# Patient Record
Sex: Female | Born: 1980 | Race: White | Hispanic: No | Smoking: Current every day smoker
Health system: Southern US, Community
[De-identification: ages and names within clinical notes are randomized; demographics above are authoritative.]

## PROBLEM LIST (undated history)

## (undated) DIAGNOSIS — G8929 Other chronic pain: Secondary | ICD-10-CM

## (undated) DIAGNOSIS — F419 Anxiety disorder, unspecified: Secondary | ICD-10-CM

## (undated) DIAGNOSIS — M549 Dorsalgia, unspecified: Secondary | ICD-10-CM

## (undated) HISTORY — PX: TUBAL LIGATION: SHX77

## (undated) HISTORY — PX: CHOLECYSTECTOMY: SHX55

---
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
## 2007-05-01 ENCOUNTER — Ambulatory Visit: Payer: Self-pay | Admitting: *Deleted

## 2007-05-01 ENCOUNTER — Inpatient Hospital Stay (HOSPITAL_COMMUNITY): Admission: AD | Admit: 2007-05-01 | Discharge: 2007-05-08 | Payer: Self-pay | Admitting: *Deleted

## 2010-04-24 ENCOUNTER — Emergency Department (HOSPITAL_COMMUNITY): Admission: EM | Admit: 2010-04-24 | Discharge: 2010-04-25 | Payer: Self-pay | Admitting: Emergency Medicine
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
## 2011-08-24 LAB — VALPROIC ACID LEVEL: Valproic Acid Lvl: 43.4 — ABNORMAL LOW

## 2011-08-24 LAB — CBC
HCT: 40.2
Hemoglobin: 13.8
MCHC: 34.4
MCV: 93.1
Platelets: 186
RBC: 4.32
RDW: 13.5
WBC: 4.2

## 2011-08-24 LAB — HEPATIC FUNCTION PANEL
ALT: 100 — ABNORMAL HIGH
AST: 88 — ABNORMAL HIGH
Albumin: 3.4 — ABNORMAL LOW
Alkaline Phosphatase: 57
Bilirubin, Direct: 0.2
Indirect Bilirubin: 0.3
Total Bilirubin: 0.5
Total Protein: 6.1

## 2014-12-16 ENCOUNTER — Emergency Department (HOSPITAL_COMMUNITY)
Admission: EM | Admit: 2014-12-16 | Discharge: 2014-12-16 | Discharge status: Against Medical Advice | Payer: Medicare Other | Attending: Emergency Medicine | Admitting: Emergency Medicine

## 2014-12-16 DIAGNOSIS — Z5329 Procedure and treatment not carried out because of patient's decision for other reasons: Secondary | ICD-10-CM | POA: Insufficient documentation

## 2014-12-21 ENCOUNTER — Encounter (HOSPITAL_COMMUNITY): Payer: Self-pay | Admitting: Emergency Medicine

## 2014-12-21 ENCOUNTER — Emergency Department (HOSPITAL_COMMUNITY)
Admission: EM | Admit: 2014-12-21 | Discharge: 2014-12-21 | Disposition: A | Discharge status: Home / Self Care | Payer: Medicare Other | Attending: Emergency Medicine | Admitting: Emergency Medicine

## 2014-12-21 DIAGNOSIS — Z8659 Personal history of other mental and behavioral disorders: Secondary | ICD-10-CM | POA: Diagnosis not present

## 2014-12-21 DIAGNOSIS — M549 Dorsalgia, unspecified: Secondary | ICD-10-CM

## 2014-12-21 DIAGNOSIS — G8929 Other chronic pain: Secondary | ICD-10-CM | POA: Insufficient documentation

## 2014-12-21 DIAGNOSIS — M545 Low back pain: Secondary | ICD-10-CM | POA: Insufficient documentation

## 2014-12-21 DIAGNOSIS — Z720 Tobacco use: Secondary | ICD-10-CM | POA: Insufficient documentation

## 2014-12-21 DIAGNOSIS — M546 Pain in thoracic spine: Secondary | ICD-10-CM | POA: Insufficient documentation

## 2014-12-21 HISTORY — DX: Anxiety disorder, unspecified: F41.9

## 2014-12-21 HISTORY — DX: Dorsalgia, unspecified: M54.9

## 2014-12-21 HISTORY — DX: Other chronic pain: G89.29

## 2014-12-21 MED ORDER — KETOROLAC TROMETHAMINE 60 MG/2ML IM SOLN
60.0000 mg | Freq: Once | INTRAMUSCULAR | Status: AC
  Administered 2014-12-21: 60 mg via INTRAMUSCULAR
  Filled 2014-12-21: qty 2

## 2014-12-21 MED ORDER — HYDROCODONE-ACETAMINOPHEN 5-325 MG PO TABS: 1.0000 | ORAL_TABLET | Freq: Four times a day (QID) | ORAL | Status: DC | PRN

## 2014-12-21 MED ORDER — CYCLOBENZAPRINE HCL 10 MG PO TABS: 10.0000 mg | ORAL_TABLET | Freq: Three times a day (TID) | ORAL | Status: DC | PRN

## 2014-12-21 MED ORDER — PREDNISONE 50 MG PO TABS: 50.0000 mg | ORAL_TABLET | Freq: Every day | ORAL | Status: DC

## 2014-12-21 MED ORDER — MORPHINE SULFATE 4 MG/ML IJ SOLN
6.0000 mg | Freq: Once | INTRAMUSCULAR | Status: AC
  Administered 2014-12-21: 6 mg via INTRAMUSCULAR
  Filled 2014-12-21: qty 2
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
## 2014-12-21 NOTE — Discharge Instructions (Signed)
Return here as needed. Follow up with the specialist.  °
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
## 2014-12-29 ENCOUNTER — Encounter (HOSPITAL_COMMUNITY): Payer: Self-pay | Admitting: Family Medicine

## 2014-12-29 ENCOUNTER — Emergency Department (HOSPITAL_COMMUNITY)
Admission: EM | Admit: 2014-12-29 | Discharge: 2014-12-29 | Disposition: A | Discharge status: Home / Self Care | Payer: Medicare Other | Attending: Emergency Medicine | Admitting: Emergency Medicine

## 2014-12-29 DIAGNOSIS — M549 Dorsalgia, unspecified: Secondary | ICD-10-CM | POA: Diagnosis present

## 2014-12-29 DIAGNOSIS — M545 Low back pain: Secondary | ICD-10-CM | POA: Insufficient documentation

## 2014-12-29 DIAGNOSIS — Z79899 Other long term (current) drug therapy: Secondary | ICD-10-CM | POA: Insufficient documentation

## 2014-12-29 DIAGNOSIS — Z8659 Personal history of other mental and behavioral disorders: Secondary | ICD-10-CM | POA: Diagnosis not present

## 2014-12-29 DIAGNOSIS — Z720 Tobacco use: Secondary | ICD-10-CM | POA: Diagnosis not present

## 2014-12-29 DIAGNOSIS — G8929 Other chronic pain: Secondary | ICD-10-CM | POA: Diagnosis not present

## 2014-12-29 DIAGNOSIS — Z7952 Long term (current) use of systemic steroids: Secondary | ICD-10-CM | POA: Diagnosis not present

## 2014-12-29 DIAGNOSIS — M542 Cervicalgia: Secondary | ICD-10-CM | POA: Insufficient documentation

## 2014-12-29 MED ORDER — PREDNISONE 20 MG PO TABS: ORAL_TABLET | ORAL | Status: DC

## 2014-12-29 MED ORDER — IBUPROFEN 800 MG PO TABS: 800.0000 mg | ORAL_TABLET | Freq: Three times a day (TID) | ORAL | Status: DC | PRN

## 2014-12-29 MED ORDER — KETOROLAC TROMETHAMINE 60 MG/2ML IM SOLN
30.0000 mg | Freq: Once | INTRAMUSCULAR | Status: AC
  Administered 2014-12-29: 30 mg via INTRAMUSCULAR
  Filled 2014-12-29: qty 2

## 2014-12-29 MED ORDER — CYCLOBENZAPRINE HCL 10 MG PO TABS: 10.0000 mg | ORAL_TABLET | Freq: Three times a day (TID) | ORAL | Status: DC | PRN

## 2014-12-29 NOTE — ED Notes (Signed)
Pt here for lower back pain. sts chronic

## 2014-12-29 NOTE — ED Notes (Signed)
Hx of chronic neck and back pain. States has appointment with "a back doctor" in March.

## 2014-12-29 NOTE — Discharge Instructions (Signed)
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
## 2015-01-14 ENCOUNTER — Encounter (HOSPITAL_COMMUNITY): Payer: Self-pay | Admitting: Emergency Medicine

## 2015-01-14 ENCOUNTER — Inpatient Hospital Stay (HOSPITAL_COMMUNITY)
Admission: EM | Admit: 2015-01-14 | Discharge: 2015-01-17 | DRG: 603 | Disposition: A | Discharge status: Home / Self Care | Payer: Medicare Other | Attending: Internal Medicine | Admitting: Internal Medicine

## 2015-01-14 ENCOUNTER — Inpatient Hospital Stay (HOSPITAL_COMMUNITY): Payer: Medicare Other

## 2015-01-14 ENCOUNTER — Emergency Department (HOSPITAL_COMMUNITY): Payer: Medicare Other

## 2015-01-14 DIAGNOSIS — F17210 Nicotine dependence, cigarettes, uncomplicated: Secondary | ICD-10-CM | POA: Diagnosis present

## 2015-01-14 DIAGNOSIS — Z66 Do not resuscitate: Secondary | ICD-10-CM | POA: Diagnosis present

## 2015-01-14 DIAGNOSIS — Z888 Allergy status to other drugs, medicaments and biological substances status: Secondary | ICD-10-CM

## 2015-01-14 DIAGNOSIS — J340 Abscess, furuncle and carbuncle of nose: Secondary | ICD-10-CM | POA: Diagnosis present

## 2015-01-14 DIAGNOSIS — F419 Anxiety disorder, unspecified: Secondary | ICD-10-CM | POA: Diagnosis present

## 2015-01-14 DIAGNOSIS — R229 Localized swelling, mass and lump, unspecified: Secondary | ICD-10-CM | POA: Diagnosis present

## 2015-01-14 DIAGNOSIS — M549 Dorsalgia, unspecified: Secondary | ICD-10-CM | POA: Diagnosis present

## 2015-01-14 DIAGNOSIS — D329 Benign neoplasm of meninges, unspecified: Secondary | ICD-10-CM | POA: Diagnosis present

## 2015-01-14 DIAGNOSIS — Z7952 Long term (current) use of systemic steroids: Secondary | ICD-10-CM

## 2015-01-14 DIAGNOSIS — G8929 Other chronic pain: Secondary | ICD-10-CM | POA: Diagnosis present

## 2015-01-14 DIAGNOSIS — L0201 Cutaneous abscess of face: Principal | ICD-10-CM

## 2015-01-14 DIAGNOSIS — L03211 Cellulitis of face: Secondary | ICD-10-CM | POA: Diagnosis present

## 2015-01-14 DIAGNOSIS — F411 Generalized anxiety disorder: Secondary | ICD-10-CM | POA: Diagnosis not present

## 2015-01-14 DIAGNOSIS — IMO0002 Reserved for concepts with insufficient information to code with codable children: Secondary | ICD-10-CM

## 2015-01-14 DIAGNOSIS — E876 Hypokalemia: Secondary | ICD-10-CM | POA: Diagnosis not present

## 2015-01-14 DIAGNOSIS — Z9049 Acquired absence of other specified parts of digestive tract: Secondary | ICD-10-CM | POA: Diagnosis present

## 2015-01-14 LAB — I-STAT CHEM 8, ED
BUN: 9 mg/dL (ref 6–23)
CALCIUM ION: 1.19 mmol/L (ref 1.12–1.23)
CHLORIDE: 99 mmol/L (ref 96–112)
Creatinine, Ser: 0.6 mg/dL (ref 0.50–1.10)
GLUCOSE: 115 mg/dL — AB (ref 70–99)
HCT: 45 % (ref 36.0–46.0)
Hemoglobin: 15.3 g/dL — ABNORMAL HIGH (ref 12.0–15.0)
POTASSIUM: 3.2 mmol/L — AB (ref 3.5–5.1)
Sodium: 138 mmol/L (ref 135–145)
TCO2: 24 mmol/L (ref 0–100)

## 2015-01-14 LAB — CBC WITH DIFFERENTIAL/PLATELET
BASOS PCT: 0 % (ref 0–1)
Basophils Absolute: 0 10*3/uL (ref 0.0–0.1)
EOS ABS: 0.1 10*3/uL (ref 0.0–0.7)
Eosinophils Relative: 1 % (ref 0–5)
HEMATOCRIT: 40.7 % (ref 36.0–46.0)
Hemoglobin: 13.7 g/dL (ref 12.0–15.0)
Lymphocytes Relative: 11 % — ABNORMAL LOW (ref 12–46)
Lymphs Abs: 1.6 10*3/uL (ref 0.7–4.0)
MCH: 31.2 pg (ref 26.0–34.0)
MCHC: 33.7 g/dL (ref 30.0–36.0)
MCV: 92.7 fL (ref 78.0–100.0)
Monocytes Absolute: 0.9 10*3/uL (ref 0.1–1.0)
Monocytes Relative: 6 % (ref 3–12)
NEUTROS PCT: 82 % — AB (ref 43–77)
Neutro Abs: 11.7 10*3/uL — ABNORMAL HIGH (ref 1.7–7.7)
Platelets: 262 10*3/uL (ref 150–400)
RBC: 4.39 MIL/uL (ref 3.87–5.11)
RDW: 13.5 % (ref 11.5–15.5)
WBC: 14.3 10*3/uL — AB (ref 4.0–10.5)

## 2015-01-14 LAB — RAPID URINE DRUG SCREEN, HOSP PERFORMED
AMPHETAMINES: POSITIVE — AB
BARBITURATES: NOT DETECTED
Benzodiazepines: POSITIVE — AB
Cocaine: NOT DETECTED
Opiates: POSITIVE — AB
Tetrahydrocannabinol: POSITIVE — AB

## 2015-01-14 LAB — I-STAT BETA HCG BLOOD, ED (MC, WL, AP ONLY): I-stat hCG, quantitative: <5 m[IU]/mL (ref <5)

## 2015-01-14 MED ORDER — MORPHINE SULFATE 4 MG/ML IJ SOLN
4.0000 mg | Freq: Once | INTRAMUSCULAR | Status: AC
  Administered 2015-01-14: 4 mg via INTRAVENOUS
  Filled 2015-01-14: qty 1

## 2015-01-14 MED ORDER — SODIUM CHLORIDE 0.9 % IV SOLN: INTRAVENOUS | Status: DC

## 2015-01-14 MED ORDER — POTASSIUM CHLORIDE 10 MEQ/100ML IV SOLN
10.0000 meq | INTRAVENOUS | Status: AC
  Administered 2015-01-14: 10 meq via INTRAVENOUS
  Filled 2015-01-14 (×3): qty 100

## 2015-01-14 MED ORDER — CLINDAMYCIN PHOSPHATE 600 MG/50ML IV SOLN
600.0000 mg | Freq: Three times a day (TID) | INTRAVENOUS | Status: DC
  Administered 2015-01-14 – 2015-01-17 (×8): 600 mg via INTRAVENOUS
  Filled 2015-01-14 (×10): qty 50

## 2015-01-14 MED ORDER — LORAZEPAM 2 MG/ML IJ SOLN: 0.5000 mg | Freq: Three times a day (TID) | INTRAMUSCULAR | Status: DC | PRN

## 2015-01-14 MED ORDER — FENTANYL CITRATE 0.05 MG/ML IJ SOLN
50.0000 ug | Freq: Once | INTRAMUSCULAR | Status: AC
  Administered 2015-01-14: 50 ug via INTRAVENOUS
  Filled 2015-01-14: qty 2

## 2015-01-14 MED ORDER — OXYCODONE-ACETAMINOPHEN 5-325 MG PO TABS
1.0000 | ORAL_TABLET | Freq: Once | ORAL | Status: AC
  Administered 2015-01-14: 1 via ORAL
  Filled 2015-01-14: qty 1

## 2015-01-14 MED ORDER — DOCUSATE SODIUM 100 MG PO CAPS
100.0000 mg | ORAL_CAPSULE | Freq: Two times a day (BID) | ORAL | Status: DC
  Administered 2015-01-14 – 2015-01-17 (×2): 100 mg via ORAL
  Filled 2015-01-14 (×7): qty 1

## 2015-01-14 MED ORDER — LORAZEPAM 2 MG/ML IJ SOLN
0.5000 mg | Freq: Once | INTRAMUSCULAR | Status: AC
  Administered 2015-01-14: 0.5 mg via INTRAVENOUS
  Filled 2015-01-14: qty 1

## 2015-01-14 MED ORDER — ONDANSETRON HCL 4 MG PO TABS: 4.0000 mg | ORAL_TABLET | Freq: Four times a day (QID) | ORAL | Status: DC | PRN

## 2015-01-14 MED ORDER — ACETAMINOPHEN 650 MG RE SUPP: 650.0000 mg | Freq: Four times a day (QID) | RECTAL | Status: DC | PRN

## 2015-01-14 MED ORDER — ALUM & MAG HYDROXIDE-SIMETH 200-200-20 MG/5ML PO SUSP: 30.0000 mL | Freq: Four times a day (QID) | ORAL | Status: DC | PRN

## 2015-01-14 MED ORDER — IOHEXOL 300 MG/ML  SOLN
100.0000 mL | Freq: Once | INTRAMUSCULAR | Status: AC | PRN
  Administered 2015-01-14: 100 mL via INTRAVENOUS

## 2015-01-14 MED ORDER — CHLORHEXIDINE GLUCONATE 0.12 % MT SOLN
15.0000 mL | Freq: Two times a day (BID) | OROMUCOSAL | Status: DC
  Administered 2015-01-15 (×2): 15 mL via OROMUCOSAL
  Filled 2015-01-14 (×5): qty 15

## 2015-01-14 MED ORDER — MORPHINE SULFATE 2 MG/ML IJ SOLN
1.0000 mg | INTRAMUSCULAR | Status: DC | PRN
  Administered 2015-01-15 (×3): 1 mg via INTRAVENOUS
  Filled 2015-01-14 (×3): qty 1

## 2015-01-14 MED ORDER — HYDROMORPHONE HCL 1 MG/ML IJ SOLN
1.0000 mg | INTRAMUSCULAR | Status: AC | PRN
  Administered 2015-01-14 – 2015-01-15 (×3): 1 mg via INTRAVENOUS
  Filled 2015-01-14 (×3): qty 1

## 2015-01-14 MED ORDER — GADOBENATE DIMEGLUMINE 529 MG/ML IV SOLN: 16.0000 mL | Freq: Once | INTRAVENOUS | Status: AC | PRN

## 2015-01-14 MED ORDER — CLINDAMYCIN PHOSPHATE 600 MG/50ML IV SOLN
600.0000 mg | Freq: Once | INTRAVENOUS | Status: AC
  Administered 2015-01-14: 600 mg via INTRAVENOUS
  Filled 2015-01-14: qty 50

## 2015-01-14 MED ORDER — ONDANSETRON HCL 4 MG/2ML IJ SOLN: 4.0000 mg | Freq: Three times a day (TID) | INTRAMUSCULAR | Status: DC | PRN

## 2015-01-14 MED ORDER — DEXTROSE-NACL 5-0.9 % IV SOLN
INTRAVENOUS | Status: DC
  Administered 2015-01-14: 22:00:00 via INTRAVENOUS

## 2015-01-14 MED ORDER — OXYCODONE-ACETAMINOPHEN 5-325 MG PO TABS: 1.0000 | ORAL_TABLET | Freq: Once | ORAL | Status: DC

## 2015-01-14 MED ORDER — HYDROCODONE-ACETAMINOPHEN 5-325 MG PO TABS
1.0000 | ORAL_TABLET | ORAL | Status: DC | PRN
  Administered 2015-01-15 – 2015-01-16 (×6): 2 via ORAL
  Filled 2015-01-14 (×7): qty 2

## 2015-01-14 MED ORDER — ONDANSETRON HCL 4 MG/2ML IJ SOLN: 4.0000 mg | Freq: Four times a day (QID) | INTRAMUSCULAR | Status: DC | PRN

## 2015-01-14 MED ORDER — ACETAMINOPHEN 325 MG PO TABS: 650.0000 mg | ORAL_TABLET | Freq: Four times a day (QID) | ORAL | Status: DC | PRN

## 2015-01-14 MED ORDER — CETYLPYRIDINIUM CHLORIDE 0.05 % MT LIQD
7.0000 mL | Freq: Two times a day (BID) | OROMUCOSAL | Status: DC
  Administered 2015-01-15: 7 mL via OROMUCOSAL

## 2015-01-14 MED ORDER — FENTANYL CITRATE 0.05 MG/ML IJ SOLN: 50.0000 ug | Freq: Once | INTRAMUSCULAR | Status: DC

## 2015-01-14 NOTE — ED Provider Notes (Signed)
CSN: [id number]     Arrival date & time 01/14/15  1323 History  This chart was scribed for non-physician practitioner, Ottie Glazier, PA-C, working with Jonetta Osgood, MD by Judithann Sauger, ED Scribe. The patient was seen in room 5W03C/5W03C-01 and the patient's care was started at 2:00 PM    Chief Complaint  Patient presents with  . Facial Pain   The history is provided by the patient. No language interpreter was used.   HPI Comments: Leslie Middleton is a 34 y.o. female with a hx of anxiety and chronic back pain who presents to the Emergency Department complaining of facial pain onset 3 days ago. She explains that she had facial swelling last year and received a shot which resolved it. Three days ago, she had facial swelling again and was given PCN and an unknown shot by her PCP and 2 days ago was given 325 mg hydrocodone and a Demerol shot at [facility] but that did not help. Today, she c/o a swollen nasal septum and a swollen upper lip as well as the roof of her mouth. She reports associated fever and productive cough with clear sputum. She denies sniffing anything. She reports that she is a current smoker.    Past Medical History  Diagnosis Date  . Anxiety   . Chronic back pain    Past Surgical History  Procedure Laterality Date  . Tubal ligation    . Cholecystectomy     History reviewed. No pertinent family history. History  Substance Use Topics  . Smoking status: Current Every Day Smoker -- 0.50 packs/day for 20 years    Types: Cigarettes  . Smokeless tobacco: Not on file  . Alcohol Use: No     Comment: occ   OB History    No data available     Review of Systems  Constitutional: Positive for fever.  HENT: Positive for facial swelling and rhinorrhea. Negative for dental problem, ear pain and sore throat.   All other systems reviewed and are negative.     Allergies  Geodon  Home Medications   Prior to Admission medications   Medication Sig Start  Date End Date Taking? Authorizing Provider  amoxicillin (AMOXIL) 500 MG capsule Take 500 mg by mouth 2 (two) times daily. 01/12/15  Yes Historical Provider, MD  amphetamine-dextroamphetamine (ADDERALL) 10 MG tablet Take 20 mg by mouth daily. 01/12/15  Yes Historical Provider, MD  cyclobenzaprine (FLEXERIL) 10 MG tablet Take 1 tablet (10 mg total) by mouth 3 (three) times daily as needed for muscle spasms. Patient not taking: Reported on 01/14/2015 12/29/14   Domenic Moras, PA-C  HYDROcodone-acetaminophen (NORCO/VICODIN) 5-325 MG per tablet Take 1 tablet by mouth every 6 (six) hours as needed for moderate pain. 12/21/14  Yes Dalia Heading, PA-C  predniSONE (DELTASONE) 20 MG tablet 2 tabs po daily x 4 days Patient not taking: Reported on 01/14/2015 12/29/14   Domenic Moras, PA-C   BP 133/86 mmHg  Pulse 78  Temp(Src) 98.2 F (36.8 C) (Oral)  Resp 18  Ht 5\' 7"  (1.702 m)  Wt 155 lb 13.8 oz (70.7 kg)  BMI 24.41 kg/m2  SpO2 97%  LMP 12/19/2014 (Exact Date) Physical Exam  Constitutional: She is oriented to person, place, and time. She appears well-developed and well-nourished. No distress.  HENT:  Head: Normocephalic.  She has tenderness to palpation, erythema, and  edema of the skin surrounding the nasal septum and upper lip.   She has no dental or gum pain.  Eyes: EOM are normal.  Neck: Neck supple. No tracheal deviation present.  Cardiovascular: Normal rate, regular rhythm and normal heart sounds.   Pulmonary/Chest: Effort normal. No respiratory distress.  Abdominal: Soft. There is no tenderness.  Musculoskeletal: Normal range of motion.  Neurological: She is alert and oriented to person, place, and time.  Skin: Skin is warm and dry.  Psychiatric: She has a normal mood and affect. Her behavior is normal.  Nursing note and vitals reviewed.       ED Course  Procedures (including critical care time) DIAGNOSTIC STUDIES: Oxygen Saturation is 96% on RA, adequate by my interpretation.     COORDINATION OF CARE: 2:12 PM- Pt advised of plan for treatment and pt agrees.    Labs Review Labs Reviewed  CBC WITH DIFFERENTIAL/PLATELET - Abnormal; Notable for the following:    WBC 14.3 (*)    Neutrophils Relative % 82 (*)    Neutro Abs 11.7 (*)    Lymphocytes Relative 11 (*)    All other components within normal limits  URINE RAPID DRUG SCREEN (HOSP PERFORMED) - Abnormal; Notable for the following:    Opiates POSITIVE (*)    Benzodiazepines POSITIVE (*)    Amphetamines POSITIVE (*)    Tetrahydrocannabinol POSITIVE (*)    All other components within normal limits  BASIC METABOLIC PANEL - Abnormal; Notable for the following:    Potassium 3.3 (*)    Glucose, Bld 128 (*)    All other components within normal limits  I-STAT CHEM 8, ED - Abnormal; Notable for the following:    Potassium 3.2 (*)    Glucose, Bld 115 (*)    Hemoglobin 15.3 (*)    All other components within normal limits  CULTURE, BLOOD (ROUTINE X 2)  CULTURE, BLOOD (ROUTINE X 2)  HIV ANTIBODY (ROUTINE TESTING)  CBC  I-STAT BETA HCG BLOOD, ED ([facility], [facility], [facility] ONLY)    Imaging Review Mr Jeri Cos Wo Contrast  01/14/2015   EXAM: MRI HEAD WITHOUT AND WITH CONTRAST  TECHNIQUE: Multiplanar, multiecho pulse sequences of the brain and surrounding structures were obtained without and with intravenous contrast.  CONTRAST:  16 cc of MultiHance.  COMPARISON:  Prior study performed earlier on the same day.  FINDINGS: Study is degraded by motion artifact.  Cerebral volume within normal limits for patient age. No focal parenchymal signal abnormality or significant white matter changes present.  No abnormal foci of restricted diffusion to suggest acute intracranial infarct. Gray-white matter differentiation maintained. Normal intravascular flow voids are present.  There is a lobulated well-circumscribed mass located within the suprasellar region extending anteriorly along the planum sphenoidale. This mass demonstrates isointense  precontrast T1 signal intensity, hypo T2 signal intensity, with avid post-contrast homogeneous enhancement. The mass measures 2.3 x 1.9 x 1.9 cm (transverse by AP by craniocaudad). This lesion is most compatible with a in meningioma. No significant edema. The lesion partially encompasses and encases the cavernous left ICA, with approximately 180 degrees of encasement (series 19, image 17). The anterior cerebral arteries are slightly displaced posterior very by this lesion. The pituitary immediately inferior to the mass appears normal. Pituitary stalk is grossly normal a midline, although is somewhat poorly evaluated as it is abuts the posterior margin of the mass. Optic chiasm remains normally positioned.  No other mass lesion or abnormal enhancement.  No hydrocephalus.  No extra-axial fluid collection.  Craniocervical junction is normal. No acute abnormality seen about the orbits.  Mild mucoperiosteal thickening present within the maxillary sinuses  and ethmoidal air cells. Scattered fluid signal intensity present within the right mastoid air cells.  Bone marrow signal intensity is normal. No scalp soft tissue abnormality.  IMPRESSION: 1. 2.3 x 1.9 x 1.9 cm suprasellar mass as above, most compatible with a meningioma. Correlation with MRA would likely be helpful prior to any potential future planned surgical intervention, as this lesion is intimately associated with the cavernous left ICA as well as the anterior cerebral arteries posteriorly. 2. No other acute intracranial process.   Electronically Signed   By: Jeannine Boga M.D.   On: 01/14/2015 22:16   Ct Maxillofacial W/cm  01/14/2015   CLINICAL DATA:  Facial swelling and redness, no known injury, initial encounter  EXAM: CT MAXILLOFACIAL WITH CONTRAST  TECHNIQUE: Multidetector CT imaging of the maxillofacial structures was performed with intravenous contrast. Multiplanar CT image reconstructions were also generated. A small metallic BB was placed on the  right temple in order to reliably differentiate right from left.  CONTRAST:  181mL OMNIPAQUE IOHEXOL 300 MG/ML  SOLN  COMPARISON:  None.  FINDINGS: No acute fracture is identified. In the area of clinical concern below the nose, there is a multiloculated fluid attenuation area with peripheral enhancement and localized inflammatory change consistent with small subcutaneous abscess. No significant lymphadenopathy is identified. Symmetrical small lymph nodes are noted within the neck bilaterally. The carotid bifurcations are patent bilaterally. The parotid and submandibular glands are within normal limits. The orbits and their contents are within normal limits.  Skull base and its contents reveal an enhancing mass lesion along the superior aspect of the sella turcica and extending anteriorly. This measures 2.0 x 1.6 x 1.2 cm in greatest AP, transverse and craniocaudad dimensions. It is difficult is separate from the enhancing pituitary. It displaces the left anterior cerebral artery towards the right. Additionally some very mild bony reaction is noted along the undersurface of the lesion. These changes may represent a meningioma arising along the planum sphenoidale. Additional considerations would include pituitary adenoma and less likely aneurysm. MRI is recommended for further evaluation. No other focal abnormality is noted.  IMPRESSION: Multiloculated enhancing fluid attenuation lesion along the inferior aspect of the nose and upper lip consistent with a focal abscess. Surrounding inflammatory changes are noted. This is consistent with the patient's given clinical history.  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 Pituitary adenoma and less likely aneurysm are also considered. Pre and post-contrast MRI is recommended for further evaluation.  These results will be called to the ordering clinician or representative by the Radiologist Assistant, and communication documented  in the PACS or zVision Dashboard.   Electronically Signed   By: Inez Catalina M.D.   On: 01/14/2015 16:40     EKG Interpretation None      MDM   Final diagnoses:  Cellulitis and abscess of face  The patient has surrounding nasal septum and upper lip swelling, erythema, and tenderness to palpation that began 3 days ago. She is currently afebrile. CBC and BMP are unremarkable. She states she has not snorted anything and is not an IV drug abuser.  Kelvin Cellar and Dr. Karle Plumber have seen the patient also and agree with the plan to CT the patient and consult ENT.  The CT shows a multiloculated lesion on the inferior aspect of the nose and upper lip consistent with a focal abscess. Dr. Raymon Mutton was consulted by T. Kirachenco and IV clindamycin has been started.  There is a call out to  internal medicine and T. Kirachenco will take over care of the patient.    I personally performed the services described in this documentation, which was scribed in my presence. The recorded information has been reviewed and is accurate.   Ottie Glazier, PA-C 01/15/15 [place], MD 01/16/15 [phone]

## 2015-01-14 NOTE — ED Notes (Signed)
Pt taken to MRI  

## 2015-01-14 NOTE — ED Notes (Signed)
Pt has red, swollen to nasal septum. States her PCP put her on PCN 2 days ago and that she was seen at [facility] yesterday and given Hydrocodone. Pt is tearful.

## 2015-01-14 NOTE — ED Notes (Signed)
Pt sts pain in face with nasal congestion and sore throat; pt tearful and sts was seen at [facility] yesterday; pt sts nose in painful due to blowing nose per pt

## 2015-01-14 NOTE — H&P (Addendum)
[facility] History and Physical  JERRI GLAUSER EVO:[id number] DOB: 11/01/1981 DOA: 01/14/2015  Referring physician: PA PCP: No primary care provider on file.   Chief Complaint:   HPI: Leslie Middleton is a 34 y.o. female with PMH significant for chronic back and anxiety who presents complaining of facial pain that started 3 days prior to admission. Sh had facial swelling last year, she received a shot which resolved it. 3 day prior to admission she saw PCP for facial swelling, she was prescribe penicillin. She went to [facility] 2 days ago and was given hydrocodone and demerol. She presents today with worsening swelling and pain of face.  She feels lips and tongue swelling is worse.  Evaluation in the ED; CT maxillo: Multiloculated enhancing fluid attenuation lesion along the inferior aspect of the nose and upper lip consistent with a focal abscess. Surrounding inflammatory changes are noted. This is consistent with the patient's given clinical history. 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 Pituitary adenoma and less likely aneurysm are also considered. Pre and post-contrast MRI is recommended for further evaluation.    Review of Systems:  Negative, except as per HPI.    Past Medical History  Diagnosis Date  . Anxiety   . Chronic back pain    Past Surgical History  Procedure Laterality Date  . Tubal ligation    . Cholecystectomy     Social History:  reports that she has been smoking.  She does not have any smokeless tobacco history on file. She reports that she drinks alcohol. She reports that she does not use illicit drugs.  Allergies  Allergen Reactions  . Geodon [Ziprasidone Hcl]    Family History; mother with history of Heart diseases.   Prior to Admission medications   Medication Sig Start Date End Date Taking? Authorizing Provider  amoxicillin (AMOXIL) 500 MG capsule Take 500 mg by mouth 2 (two) times daily.  01/12/15  Yes Historical Provider, MD  amphetamine-dextroamphetamine (ADDERALL) 10 MG tablet Take 20 mg by mouth daily. 01/12/15  Yes Historical Provider, MD  cyclobenzaprine (FLEXERIL) 10 MG tablet Take 1 tablet (10 mg total) by mouth 3 (three) times daily as needed for muscle spasms. Patient not taking: Reported on 01/14/2015 12/29/14   Domenic Moras, PA-C  HYDROcodone-acetaminophen (NORCO/VICODIN) 5-325 MG per tablet Take 1 tablet by mouth every 6 (six) hours as needed for moderate pain. 12/21/14  Yes Dalia Heading, PA-C  predniSONE (DELTASONE) 20 MG tablet 2 tabs po daily x 4 days Patient not taking: Reported on 01/14/2015 12/29/14   Domenic Moras, PA-C   Physical Exam: Filed Vitals:   01/14/15 1338  BP: 155/106  Pulse: 97  Temp: 98.5 F (36.9 C)  TempSrc: Oral  Resp: 18  SpO2: 96%    Wt Readings from Last 3 Encounters:  12/29/14 72.576 kg (160 lb)    General:  Appears calm and comfortable Eyes: PERRL, normal lids, irises & conjunctiva ENT: grossly normal hearing, upper lip with swelling, nose with swelling.  Neck: no LAD, masses or thyromegaly Cardiovascular: RRR, no m/r/g. No LE edema. Telemetry: SR, no arrhythmias  Respiratory: CTA bilaterally, no w/r/r. Normal respiratory effort. Abdomen: soft, ntnd Skin: no rash or induration seen on limited exam Musculoskeletal: grossly normal tone BUE/BLE Psychiatric: grossly normal mood and affect, speech fluent and appropriate Neurologic: grossly non-focal.          Labs on Admission:  Basic Metabolic Panel:  Recent Labs Lab 01/14/15 1750  NA 138  K 3.2*  CL 99  GLUCOSE 115*  BUN 9  CREATININE 0.60   Liver Function Tests: No results for input(s): AST, ALT, ALKPHOS, BILITOT, PROT, ALBUMIN in the last 168 hours. No results for input(s): LIPASE, AMYLASE in the last 168 hours. No results for input(s): AMMONIA in the last 168 hours. CBC:  Recent Labs Lab 01/14/15 1724 01/14/15 1750  WBC 14.3*  --   NEUTROABS 11.7*  --   HGB  13.7 15.3*  HCT 40.7 45.0  MCV 92.7  --   PLT 262  --    Cardiac Enzymes: No results for input(s): CKTOTAL, CKMB, CKMBINDEX, TROPONINI in the last 168 hours.  BNP (last 3 results) No results for input(s): BNP in the last 8760 hours.  ProBNP (last 3 results) No results for input(s): PROBNP in the last 8760 hours.  CBG: No results for input(s): GLUCAP in the last 168 hours.  Radiological Exams on Admission: Ct Maxillofacial W/cm  01/14/2015   CLINICAL DATA:  Facial swelling and redness, no known injury, initial encounter  EXAM: CT MAXILLOFACIAL WITH CONTRAST  TECHNIQUE: Multidetector CT imaging of the maxillofacial structures was performed with intravenous contrast. Multiplanar CT image reconstructions were also generated. A small metallic BB was placed on the right temple in order to reliably differentiate right from left.  CONTRAST:  158mL OMNIPAQUE IOHEXOL 300 MG/ML  SOLN  COMPARISON:  None.  FINDINGS: No acute fracture is identified. In the area of clinical concern below the nose, there is a multiloculated fluid attenuation area with peripheral enhancement and localized inflammatory change consistent with small subcutaneous abscess. No significant lymphadenopathy is identified. Symmetrical small lymph nodes are noted within the neck bilaterally. The carotid bifurcations are patent bilaterally. The parotid and submandibular glands are within normal limits. The orbits and their contents are within normal limits.  Skull base and its contents reveal an enhancing mass lesion along the superior aspect of the sella turcica and extending anteriorly. This measures 2.0 x 1.6 x 1.2 cm in greatest AP, transverse and craniocaudad dimensions. It is difficult is separate from the enhancing pituitary. It displaces the left anterior cerebral artery towards the right. Additionally some very mild bony reaction is noted along the undersurface of the lesion. These changes may represent a meningioma arising along the  planum sphenoidale. Additional considerations would include pituitary adenoma and less likely aneurysm. MRI is recommended for further evaluation. No other focal abnormality is noted.  IMPRESSION: Multiloculated enhancing fluid attenuation lesion along the inferior aspect of the nose and upper lip consistent with a focal abscess. Surrounding inflammatory changes are noted. This is consistent with the patient's given clinical history.  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 Pituitary adenoma and less likely aneurysm are also considered. Pre and post-contrast MRI is recommended for further evaluation.  These results will be called to the ordering clinician or representative by the Radiologist Assistant, and communication documented in the PACS or zVision Dashboard.   Electronically Signed   By: Inez Catalina M.D.   On: 01/14/2015 16:40    EKG: Independently reviewed. None available.   Assessment/Plan Active Problems:   Facial abscess   1-Facial Abscess;  IV clindamycin.  ENT consulted.  NPO. IV pain medication.   Check UDS, Screening HIV.  Blood culture.   2-Hypokalemia;  Replete with IV.   3-Anxiety: Ativan PRN.  4-Intra craneal Mass:  MRI ordered.   Code Status: Full code.  DVT Prophylaxis: SCD.  Family Communication: Care discussed  with patient, friend at bedside.  Disposition Plan: expect 2 to 3 days inpatient,.   Time spent: 75 minutes.   Niel Hummer A [facility] Pager [phone]

## 2015-01-14 NOTE — Consult Note (Signed)
Reason for Consult: Facial and nasal cellulitis/abscess  HPI:  Leslie Middleton is an 34 y.o. female who presented to the [facility] ER today c/o facial pain and swelling for 4 days. The patient was seen at several local ERs. Treated with amoxicillin without improvement.  Now c/o significant pain. Facial CT shows soft tissue edema with multiple small multiloculated abscess. The CT also shows an intracranial lesion, suspicious for a meningioma.  Past Medical History  Diagnosis Date  . Anxiety   . Chronic back pain     Past Surgical History  Procedure Laterality Date  . Tubal ligation    . Cholecystectomy      History reviewed. No pertinent family history.  Social History:  reports that she has been smoking.  She does not have any smokeless tobacco history on file. She reports that she drinks alcohol. She reports that she does not use illicit drugs.  Allergies:  Allergies  Allergen Reactions  . Geodon [Ziprasidone Hcl]     Prior to Admission medications   Medication Sig Start Date End Date Taking? Authorizing Provider  cyclobenzaprine (FLEXERIL) 10 MG tablet Take 1 tablet (10 mg total) by mouth 3 (three) times daily as needed for muscle spasms. 12/29/14   Domenic Moras, PA-C  HYDROcodone-acetaminophen (NORCO/VICODIN) 5-325 MG per tablet Take 1 tablet by mouth every 6 (six) hours as needed for moderate pain. 12/21/14   Dalia Heading, PA-C  predniSONE (DELTASONE) 20 MG tablet 2 tabs po daily x 4 days 12/29/14   Domenic Moras, PA-C    Medications:  I have reviewed the patient's current medications. Scheduled: . LORazepam  0.5 mg Intravenous Once   PRN:  No results found for this or any previous visit (from the past 48 hour(s)).  Ct Maxillofacial W/cm  01/14/2015   CLINICAL DATA:  Facial swelling and redness, no known injury, initial encounter  EXAM: CT MAXILLOFACIAL WITH CONTRAST  TECHNIQUE: Multidetector CT imaging of the maxillofacial structures was performed with intravenous contrast.  Multiplanar CT image reconstructions were also generated. A small metallic BB was placed on the right temple in order to reliably differentiate right from left.  CONTRAST:  136mL OMNIPAQUE IOHEXOL 300 MG/ML  SOLN  COMPARISON:  None.  FINDINGS: No acute fracture is identified. In the area of clinical concern below the nose, there is a multiloculated fluid attenuation area with peripheral enhancement and localized inflammatory change consistent with small subcutaneous abscess. No significant lymphadenopathy is identified. Symmetrical small lymph nodes are noted within the neck bilaterally. The carotid bifurcations are patent bilaterally. The parotid and submandibular glands are within normal limits. The orbits and their contents are within normal limits.  Skull base and its contents reveal an enhancing mass lesion along the superior aspect of the sella turcica and extending anteriorly. This measures 2.0 x 1.6 x 1.2 cm in greatest AP, transverse and craniocaudad dimensions. It is difficult is separate from the enhancing pituitary. It displaces the left anterior cerebral artery towards the right. Additionally some very mild bony reaction is noted along the undersurface of the lesion. These changes may represent a meningioma arising along the planum sphenoidale. Additional considerations would include pituitary adenoma and less likely aneurysm. MRI is recommended for further evaluation. No other focal abnormality is noted.  IMPRESSION: Multiloculated enhancing fluid attenuation lesion along the inferior aspect of the nose and upper lip consistent with a focal abscess. Surrounding inflammatory changes are noted. This is consistent with the patient's given clinical history.  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 Pituitary adenoma and less likely aneurysm are also considered. Pre and post-contrast MRI is recommended for further evaluation.  These results will be called to the  ordering clinician or representative by the Radiologist Assistant, and communication documented in the PACS or zVision Dashboard.   Electronically Signed   By: Inez Catalina M.D.   On: 01/14/2015 16:40   Review of Systems  Constitutional: Positive for fever.  HENT: Positive for facial swelling and rhinorrhea. Negative for dental problem, ear pain and sore throat.  All other systems are negative.  Blood pressure 155/106, pulse 97, temperature 98.5 F (36.9 C), temperature source Oral, resp. rate 18, last menstrual period 12/19/2014, SpO2 96 %.  Physical Exam  Constitutional: She is oriented to person, place, and time. She appears well-developed and well-nourished.  Head: Normocephalic, atraumatic. Eyes: EOM are normal. PERRL. Ears: Normal auricles and EACs. Nose: She has tenderness to palpation and edema of the nasal septum and upper lip. No purulent drainage. She has no dental or gum pain.  Neck: Neck supple. No tracheal deviation present. No significant LAD. Cardiovascular: Normal rate, regular rhythm. Pulmonary/Chest: Effort normal. No respiratory distress. Musculoskeletal: Normal range of motion.  Neurological: She is alert and oriented to person, place, and time.  Skin: Skin is warm and dry.  Psychiatric: She has a normal mood and affect. Her behavior is normal.   Assessment/Plan: Multiple small loculated nasal and facial abscesses. Pt will be admitted for IV abx by hospitalist. Consider IV clindamycin. Will follow.  Pt also has an intracranial lesion. Consider an MRI scan. Will need neurosurgery evaluation.   Sergey Ishler,SUI W 01/14/2015, 5:20 PM

## 2015-01-14 NOTE — ED Provider Notes (Signed)
PT seen and examined by me in ED. Pt with nasal and upper lip swelling for several days. On amoxicillin. Worsening. No fever, chills, malaise. Exam consistent with nasal abscess. Discussed with Dr. Kathrynn Humble, will get CT of maxillofacial for further evaluation.   5:24 PM CT showing multiloculated abscess to the inferior nasal septum. Will get ENT involved. PT also has a lesion along scull base which represents possible meningioma vs pituitary adenoma or aneurism.   Discussed with Dr. Benjamine Mola, will come see. Asked for clindamycin IV. Admit to medicine.   Jeannett Senior, PA-C 01/14/15 [place], MD 01/16/15 [phone]

## 2015-01-14 NOTE — ED Notes (Signed)
Attempted report x1. 

## 2015-01-15 DIAGNOSIS — F411 Generalized anxiety disorder: Secondary | ICD-10-CM

## 2015-01-15 DIAGNOSIS — E876 Hypokalemia: Secondary | ICD-10-CM

## 2015-01-15 LAB — BASIC METABOLIC PANEL
Anion gap: 7 (ref 5–15)
BUN: 8 mg/dL (ref 6–23)
CHLORIDE: 103 mmol/L (ref 96–112)
CO2: 27 mmol/L (ref 19–32)
Calcium: 8.6 mg/dL (ref 8.4–10.5)
Creatinine, Ser: 0.59 mg/dL (ref 0.50–1.10)
GFR calc Af Amer: >90 mL/min (ref >90)
Glucose, Bld: 128 mg/dL — ABNORMAL HIGH (ref 70–99)
Potassium: 3.3 mmol/L — ABNORMAL LOW (ref 3.5–5.1)
SODIUM: 137 mmol/L (ref 135–145)

## 2015-01-15 LAB — CBC
HCT: 38.1 % (ref 36.0–46.0)
HEMOGLOBIN: 13 g/dL (ref 12.0–15.0)
MCH: 31.5 pg (ref 26.0–34.0)
MCHC: 34.1 g/dL (ref 30.0–36.0)
MCV: 92.3 fL (ref 78.0–100.0)
Platelets: 240 10*3/uL (ref 150–400)
RBC: 4.13 MIL/uL (ref 3.87–5.11)
RDW: 13.5 % (ref 11.5–15.5)
WBC: 9.1 10*3/uL (ref 4.0–10.5)

## 2015-01-15 LAB — HIV ANTIBODY (ROUTINE TESTING W REFLEX): HIV SCREEN 4TH GENERATION: NONREACTIVE

## 2015-01-15 MED ORDER — ALPRAZOLAM 0.25 MG PO TABS
0.2500 mg | ORAL_TABLET | Freq: Three times a day (TID) | ORAL | Status: DC | PRN
  Administered 2015-01-15 (×2): 0.25 mg via ORAL
  Filled 2015-01-15 (×2): qty 1

## 2015-01-15 MED ORDER — MORPHINE SULFATE 2 MG/ML IJ SOLN
2.0000 mg | INTRAMUSCULAR | Status: DC | PRN
  Administered 2015-01-16: 2 mg via INTRAVENOUS
  Filled 2015-01-15 (×2): qty 1

## 2015-01-15 MED ORDER — POTASSIUM CHLORIDE 2 MEQ/ML IV SOLN
INTRAVENOUS | Status: DC
  Administered 2015-01-15 – 2015-01-16 (×2): via INTRAVENOUS
  Filled 2015-01-15 (×5): qty 1000

## 2015-01-15 MED ORDER — AMPHETAMINE-DEXTROAMPHETAMINE 10 MG PO TABS
20.0000 mg | ORAL_TABLET | Freq: Every day | ORAL | Status: DC
  Administered 2015-01-15 – 2015-01-17 (×3): 20 mg via ORAL
  Filled 2015-01-15 (×3): qty 2

## 2015-01-15 NOTE — Progress Notes (Addendum)
PATIENT DETAILS Name: Leslie Middleton Age: 34 y.o. Sex: female Date of Birth: 1981/08/12 Admit Date: 01/14/2015 Admitting Physician Elmarie Shiley, MD PCP:No primary care provider on file.  Subjective: Claims that swelling slightly better-especially around the nose. Anxious.  Assessment/Plan: Principal Problem:   Facial abscess: Then started on intravenous clindamycin. ENT consulted and following. CT of the maxillofacial region does confirm multiple abscesses. Await follow-up. 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 Suspect this is an incidental finding.  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 Will need outpatient follow up with Neurosurgery upon discharge.    Anxiety:prn Ativan.    Hypokalemia:add K to IVF. Follow  Disposition: Remain inpatient  Antibiotics:  See below   Anti-infectives    Start     Dose/Rate Route Frequency Ordered Stop   01/14/15 2200  clindamycin (CLEOCIN) IVPB 600 mg     600 mg 100 mL/hr over 30 Minutes Intravenous 3 times per day 01/14/15 1822     01/14/15 1715  clindamycin (CLEOCIN) IVPB 600 mg     600 mg 100 mL/hr over 30 Minutes Intravenous  Once 01/14/15 1711 01/14/15 1853      DVT Prophylaxis: SCD's-will start pharmacological prophylaxis once no need for I&D   Code Status: Full code or DNR  Family Communication None at bedside  Procedures:  None  CONSULTS:  ENT  Time spent 40 minutes-which includes 50% of the time with face-to-face with patient/ family and coordinating care related to the above assessment and plan.  MEDICATIONS: Scheduled Meds: . antiseptic oral rinse  7 mL Mouth Rinse q12n4p  . chlorhexidine  15 mL Mouth Rinse BID  . clindamycin (CLEOCIN) IV  600 mg Intravenous 3 times per day  . docusate sodium  100 mg Oral BID    Continuous Infusions: . dextrose 5 % and 0.9% NaCl 100 mL/hr at 01/14/15 2210   PRN Meds:.acetaminophen **OR** acetaminophen, alum & mag hydroxide-simeth, HYDROcodone-acetaminophen, LORazepam, morphine injection, ondansetron **OR** ondansetron (ZOFRAN) IV    PHYSICAL EXAM: Vital signs in last 24 hours: Filed Vitals:   01/14/15 1338 01/14/15 1847 01/14/15 2159 01/15/15 0531  BP: 155/106 146/92 147/91 137/86  Pulse: 97 90 84 85  Temp: 98.5 F (36.9 C) 98.9 F (37.2 C) 98.4 F (36.9 C) 98.2 F (36.8 C)  TempSrc: Oral Oral Oral Oral  Resp: 18 18 18 18   Height:   5\' 7"  (1.702 m)   Weight:   70.7 kg (155 lb 13.8 oz)   SpO2: 96% 100% 100% 97%    Weight change:  Filed Weights   01/14/15 2159  Weight: 70.7 kg (155 lb 13.8 oz)   Body mass index is 24.41 kg/(m^2).   Gen Exam: Awake and alert with clear speech.  Still with facial swelling-mostly around the nose and in the left maxillary area. Neck: Supple, No JVD.   Chest: B/L Clear.   CVS: S1 S2 Regular, no murmurs.  Abdomen: soft, BS +, non tender, non distended.  Extremities: no edema, lower extremities warm to touch. Neurologic: Non Focal.   Skin: No Rash.   Wounds: N/A.    Intake/Output from previous day:  Intake/Output Summary (Last 24 hours) at 01/15/15 1221 Last data filed at 01/15/15 1204  Gross per 24 hour  Intake      0 ml  Output  200 ml  Net   -200 ml     LAB RESULTS: CBC  Recent Labs Lab 01/14/15 1724 01/14/15 1750 01/15/15 0735  WBC 14.3*  --  9.1  HGB 13.7 15.3* 13.0  HCT 40.7 45.0 38.1  PLT 262  --  240  MCV 92.7  --  92.3  MCH 31.2  --  31.5  MCHC 33.7  --  34.1  RDW 13.5  --  13.5  LYMPHSABS 1.6  --   --   MONOABS 0.9  --   --   EOSABS 0.1  --   --   BASOSABS 0.0  --   --     Chemistries   Recent Labs Lab 01/14/15 1750 01/15/15 0735  NA 138 137  K 3.2* 3.3*  CL 99 103  CO2  --  27  GLUCOSE 115* 128*  BUN 9 8  CREATININE 0.60 0.59  CALCIUM  --  8.6    CBG: No  results for input(s): GLUCAP in the last 168 hours.  GFR Estimated Creatinine Clearance: 97.3 mL/min (by C-G formula based on Cr of 0.59).  Coagulation profile No results for input(s): INR, PROTIME in the last 168 hours.  Cardiac Enzymes No results for input(s): CKMB, TROPONINI, MYOGLOBIN in the last 168 hours.  Invalid input(s): CK  Invalid input(s): POCBNP No results for input(s): DDIMER in the last 72 hours. No results for input(s): HGBA1C in the last 72 hours. No results for input(s): CHOL, HDL, LDLCALC, TRIG, CHOLHDL, LDLDIRECT in the last 72 hours. No results for input(s): TSH, T4TOTAL, T3FREE, THYROIDAB in the last 72 hours.  Invalid input(s): FREET3 No results for input(s): VITAMINB12, FOLATE, FERRITIN, TIBC, IRON, RETICCTPCT in the last 72 hours. No results for input(s): LIPASE, AMYLASE in the last 72 hours.  Urine Studies No results for input(s): UHGB, CRYS in the last 72 hours.  Invalid input(s): UACOL, UAPR, USPG, UPH, UTP, UGL, UKET, UBIL, UNIT, UROB, ULEU, UEPI, UWBC, URBC, UBAC, CAST, UCOM, BILUA  MICROBIOLOGY: No results found for this or any previous visit (from the past 240 hour(s)).  RADIOLOGY STUDIES/RESULTS: Mr Kizzie Fantasia Contrast  01/14/2015   EXAM: MRI HEAD WITHOUT AND WITH CONTRAST  TECHNIQUE: Multiplanar, multiecho pulse sequences of the brain and surrounding structures were obtained without and with intravenous contrast.  CONTRAST:  16 cc of MultiHance.  COMPARISON:  Prior study performed earlier on the same day.  FINDINGS: Study is degraded by motion artifact.  Cerebral volume within normal limits for patient age. No focal parenchymal signal abnormality or significant white matter changes present.  No abnormal foci of restricted diffusion to suggest acute intracranial infarct. Gray-white matter differentiation maintained. Normal intravascular flow voids are present.  There is a lobulated well-circumscribed mass located within the suprasellar region extending  anteriorly along the planum sphenoidale. This mass demonstrates isointense precontrast T1 signal intensity, hypo T2 signal intensity, with avid post-contrast homogeneous enhancement. The mass measures 2.3 x 1.9 x 1.9 cm (transverse by AP by craniocaudad). This lesion is most compatible with a in meningioma. No significant edema. The lesion partially encompasses and encases the cavernous left ICA, with approximately 180 degrees of encasement (series 19, image 17). The anterior cerebral arteries are slightly displaced posterior very by this lesion. The pituitary immediately inferior to the mass appears normal. Pituitary stalk is grossly normal a midline, although is somewhat poorly evaluated as it is abuts the posterior margin of the mass. Optic chiasm remains normally positioned.  No other mass lesion or  abnormal enhancement.  No hydrocephalus.  No extra-axial fluid collection.  Craniocervical junction is normal. No acute abnormality seen about the orbits.  Mild mucoperiosteal thickening present within the maxillary sinuses and ethmoidal air cells. Scattered fluid signal intensity present within the right mastoid air cells.  Bone marrow signal intensity is normal. No scalp soft tissue abnormality.  IMPRESSION: 1. 2.3 x 1.9 x 1.9 cm suprasellar mass as above, most compatible with a meningioma. Correlation with MRA would likely be helpful prior to any potential future planned surgical intervention, as this lesion is intimately associated with the cavernous left ICA as well as the anterior cerebral arteries posteriorly. 2. No other acute intracranial process.   Electronically Signed   By: Jeannine Boga M.D.   On: 01/14/2015 22:16   Ct Maxillofacial W/cm  01/14/2015   CLINICAL DATA:  Facial swelling and redness, no known injury, initial encounter  EXAM: CT MAXILLOFACIAL WITH CONTRAST  TECHNIQUE: Multidetector CT imaging of the maxillofacial structures was performed with intravenous contrast. Multiplanar CT image  reconstructions were also generated. A small metallic BB was placed on the right temple in order to reliably differentiate right from left.  CONTRAST:  120mL OMNIPAQUE IOHEXOL 300 MG/ML  SOLN  COMPARISON:  None.  FINDINGS: No acute fracture is identified. In the area of clinical concern below the nose, there is a multiloculated fluid attenuation area with peripheral enhancement and localized inflammatory change consistent with small subcutaneous abscess. No significant lymphadenopathy is identified. Symmetrical small lymph nodes are noted within the neck bilaterally. The carotid bifurcations are patent bilaterally. The parotid and submandibular glands are within normal limits. The orbits and their contents are within normal limits.  Skull base and its contents reveal an enhancing mass lesion along the superior aspect of the sella turcica and extending anteriorly. This measures 2.0 x 1.6 x 1.2 cm in greatest AP, transverse and craniocaudad dimensions. It is difficult is separate from the enhancing pituitary. It displaces the left anterior cerebral artery towards the right. Additionally some very mild bony reaction is noted along the undersurface of the lesion. These changes may represent a meningioma arising along the planum sphenoidale. Additional considerations would include pituitary adenoma and less likely aneurysm. MRI is recommended for further evaluation. No other focal abnormality is noted.  IMPRESSION: Multiloculated enhancing fluid attenuation lesion along the inferior aspect of the nose and upper lip consistent with a focal abscess. Surrounding inflammatory changes are noted. This is consistent with the patient's given clinical history.  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 Pituitary adenoma and less likely aneurysm are also considered. Pre and post-contrast MRI is recommended for further evaluation.  These results will be called to the ordering clinician or  representative by the Radiologist Assistant, and communication documented in the PACS or zVision Dashboard.   Electronically Signed   By: Inez Catalina M.D.   On: 01/14/2015 16:40    Oren Binet, MD  [facility] Pager:[phone]  If 7PM-7AM, please contact night-coverage [email or web address] Password [email or web address] 01/15/2015, 12:21 PM   LOS: 1 day

## 2015-01-15 NOTE — Progress Notes (Signed)
Subjective: Pt c/o nasal and facial pain.   Objective: Vital signs in last 24 hours: Temp:  [98.2 F (36.8 C)-98.9 F (37.2 C)] 98.2 F (36.8 C) (03/10 1349) Pulse Rate:  [78-90] 78 (03/10 1349) Resp:  [18] 18 (03/10 1349) BP: (133-147)/(86-92) 133/86 mmHg (03/10 1349) SpO2:  [97 %-100 %] 97 % (03/10 1349) Weight:  [155 lb 13.8 oz (70.7 kg)] 155 lb 13.8 oz (70.7 kg) (03/09 2159)  Physical Exam  Constitutional: She is oriented to person, place, and time. She appears well-developed and well-nourished.  Head: Normocephalic, atraumatic. Eyes: EOM are normal. PERRL. Ears: Normal auricles and EACs. Nose: She has tenderness to palpation and edema of the nasal septum and upper lip. No purulent drainage. She has no dental or gum pain.  Neck: Neck supple. No tracheal deviation present. No significant LAD. Skin: Skin is warm and dry.  Psychiatric: She has a normal mood and affect. Her behavior is normal.    Recent Labs  01/14/15 1724 01/14/15 1750 01/15/15 0735  WBC 14.3*  --  9.1  HGB 13.7 15.3* 13.0  HCT 40.7 45.0 38.1  PLT 262  --  240    Recent Labs  01/14/15 1750 01/15/15 0735  NA 138 137  K 3.2* 3.3*  CL 99 103  CO2  --  27  GLUCOSE 115* 128*  BUN 9 8  CREATININE 0.60 0.59  CALCIUM  --  8.6    Medications:  I have reviewed the patient's current medications. Scheduled: . antiseptic oral rinse  7 mL Mouth Rinse q12n4p  . chlorhexidine  15 mL Mouth Rinse BID  . clindamycin (CLEOCIN) IV  600 mg Intravenous 3 times per day  . docusate sodium  100 mg Oral BID   OPF:[id number] **OR** acetaminophen, alum & mag hydroxide-simeth, HYDROcodone-acetaminophen, LORazepam, morphine injection, ondansetron **OR** ondansetron (ZOFRAN) IV  Assessment/Plan: Facial/nasal cellulitis and abscess. Pt still has significant edema and erythema. WBC has normalized. Continue IV clindamycin. Will follow.   LOS: 1 day   Leslie Middleton,SUI W 01/15/2015, 2:04 PM

## 2015-01-15 NOTE — Progress Notes (Signed)
Patient arrived to floor via stretcher with boyfriend at bedside.  Alert and oriented; evident eye/facial swelling.   Assisted to her bedside x1 .  Shown usage of bed and how to call for assistance.  Verbalized understanding.  Will continue to monitor.

## 2015-01-16 LAB — CBC
HEMATOCRIT: 34.8 % — AB (ref 36.0–46.0)
Hemoglobin: 11.6 g/dL — ABNORMAL LOW (ref 12.0–15.0)
MCH: 31.2 pg (ref 26.0–34.0)
MCHC: 33.3 g/dL (ref 30.0–36.0)
MCV: 93.5 fL (ref 78.0–100.0)
Platelets: 206 10*3/uL (ref 150–400)
RBC: 3.72 MIL/uL — ABNORMAL LOW (ref 3.87–5.11)
RDW: 13.4 % (ref 11.5–15.5)
WBC: 6.5 10*3/uL (ref 4.0–10.5)

## 2015-01-16 LAB — BASIC METABOLIC PANEL
Anion gap: 7 (ref 5–15)
CALCIUM: 8.5 mg/dL (ref 8.4–10.5)
CO2: 26 mmol/L (ref 19–32)
Chloride: 107 mmol/L (ref 96–112)
Creatinine, Ser: 0.62 mg/dL (ref 0.50–1.10)
Glucose, Bld: 102 mg/dL — ABNORMAL HIGH (ref 70–99)
Potassium: 3.5 mmol/L (ref 3.5–5.1)
Sodium: 140 mmol/L (ref 135–145)

## 2015-01-16 MED ORDER — NICOTINE 21 MG/24HR TD PT24
21.0000 mg | MEDICATED_PATCH | Freq: Every day | TRANSDERMAL | Status: DC
  Administered 2015-01-16 – 2015-01-17 (×2): 21 mg via TRANSDERMAL
  Filled 2015-01-16 (×2): qty 1

## 2015-01-16 MED ORDER — ALPRAZOLAM 0.5 MG PO TABS
1.0000 mg | ORAL_TABLET | Freq: Three times a day (TID) | ORAL | Status: DC | PRN
  Administered 2015-01-16 (×2): 1 mg via ORAL
  Filled 2015-01-16 (×2): qty 2

## 2015-01-16 MED ORDER — ENOXAPARIN SODIUM 40 MG/0.4ML ~~LOC~~ SOLN
40.0000 mg | SUBCUTANEOUS | Status: DC
  Administered 2015-01-16: 40 mg via SUBCUTANEOUS
  Filled 2015-01-16 (×2): qty 0.4

## 2015-01-16 NOTE — Plan of Care (Signed)
Problem: Phase I Progression Outcomes Goal: Initial discharge plan identified To return home

## 2015-01-16 NOTE — Progress Notes (Signed)
Subjective: Pt reports feeling better. Her pain has decreased.  Objective: Vital signs in last 24 hours: Temp:  [97.8 F (36.6 C)-98.2 F (36.8 C)] 98.1 F (36.7 C) (03/11 0531) Pulse Rate:  [67-82] 82 (03/11 0531) Resp:  [18] 18 (03/11 0531) BP: (123-133)/(77-86) 123/77 mmHg (03/11 0531) SpO2:  [97 %-99 %] 98 % (03/11 0531)  Physical Exam  Constitutional: She is oriented to person, place, and time. She appears well-developed and well-nourished.  Head: Normocephalic, atraumatic. Eyes: EOM are normal. PERRL. Ears: Normal auricles and EACs. Nose: She has tenderness to palpation and edema of the nasal septum and upper lip.  She has no dental or gum pain.  Neck: Neck supple. No tracheal deviation present. No significant LAD. Skin: Skin is warm and dry.  Psychiatric: She has a normal mood and affect. Her behavior is normal.    Recent Labs  01/15/15 0735 01/16/15 0532  WBC 9.1 6.5  HGB 13.0 11.6*  HCT 38.1 34.8*  PLT 240 206    Recent Labs  01/15/15 0735 01/16/15 0532  NA 137 140  K 3.3* 3.5  CL 103 107  CO2 27 26  GLUCOSE 128* 102*  BUN 8 <5*  CREATININE 0.59 0.62  CALCIUM 8.6 8.5    Medications:  I have reviewed the patient's current medications. Scheduled: . amphetamine-dextroamphetamine  20 mg Oral Daily  . antiseptic oral rinse  7 mL Mouth Rinse q12n4p  . chlorhexidine  15 mL Mouth Rinse BID  . clindamycin (CLEOCIN) IV  600 mg Intravenous 3 times per day  . docusate sodium  100 mg Oral BID   STM:[id number] **OR** acetaminophen, ALPRAZolam, alum & mag hydroxide-simeth, HYDROcodone-acetaminophen, morphine injection, ondansetron **OR** ondansetron (ZOFRAN) IV  Assessment/Plan: Facial/nasal cellulitis and abscess. Clinically improved.  WBC decreased and normalized. Consider switching to oral clindamycin (e.g. 300mg  po QID for 10days).  Consider d/c home tomorrow if continues to do well. Pt to f/u in my office on Monday if d/c home.    LOS: 2 days    Cherae Marton,SUI W 01/16/2015, 11:44 AM

## 2015-01-16 NOTE — Care Management Note (Signed)
    Page 1 of 1   01/16/2015     11:02:08 AM CARE MANAGEMENT NOTE 01/16/2015  Patient:  Leslie Middleton, Leslie Middleton   Account Number:  [id number]  Date Initiated:  01/16/2015  Documentation initiated by:  Lorne Skeens  Subjective/Objective Assessment:   patient was admitted with facial cellulitis/abscess.  Lives at home with significant other.     Action/Plan:   Will follow for discharge needs.   Anticipated DC Date:     Anticipated DC Plan:  HOME/SELF CARE         Choice offered to / List presented to:             Status of service:  In process, will continue to follow Medicare Important Message given?  YES (If response is "NO", the following Medicare IM given date fields will be blank) Date Medicare IM given:  01/16/2015 Medicare IM given by:  Lorne Skeens Date Additional Medicare IM given:   Additional Medicare IM given by:    Discharge Disposition:    Per UR Regulation:  Reviewed for med. necessity/level of care/duration of stay  If discussed at [place] of Stay Meetings, dates discussed:    Comments:  01/16/15 [place], MSN, CM- Medicare IM letter provided.

## 2015-01-16 NOTE — Progress Notes (Signed)
PATIENT DETAILS Name: Leslie Middleton Age: 34 y.o. Sex: female Date of Birth: 1981/08/01 Admit Date: 01/14/2015 Admitting Physician Elmarie Shiley, MD PCP:No primary care provider on file.  Subjective: Much better, significantly decreased perinasal swelling and swelling in her bilateral maxillary area  Assessment/Plan: Principal Problem:   Facial abscess: Admitted and started on intravenous clindamycin. ENT consulted and following. CT of the maxillofacial region does confirm multiple small abscesses. Per ENT, no need for incision and drainage. Significantly improved with antibiotics, will plan on transitioning to oral clindamycin on 3/12 and discharge.    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 MRI images reviewed with Dr. Annette Stable, recommendations for outpatient follow-up with neurosurgery upon discharge    Anxiety: Continue as needed Xanax.    Hypokalemia: Resolved  Disposition: Remain inpatient-home on 3/12  Antibiotics:  See below   Anti-infectives    Start     Dose/Rate Route Frequency Ordered Stop   01/14/15 2200  clindamycin (CLEOCIN) IVPB 600 mg     600 mg 100 mL/hr over 30 Minutes Intravenous 3 times per day 01/14/15 1822     01/14/15 1715  clindamycin (CLEOCIN) IVPB 600 mg     600 mg 100 mL/hr over 30 Minutes Intravenous  Once 01/14/15 1711 01/14/15 1853      DVT Prophylaxis: Prophylactic Lovenox  Code Status: Full code   Family Communication Multiple friends at bedside-spoke to them after patient's permission obtained  Procedures:  None  CONSULTS:  ENT  MEDICATIONS: Scheduled Meds: . amphetamine-dextroamphetamine  20 mg Oral Daily  . antiseptic oral rinse  7 mL Mouth Rinse q12n4p  . chlorhexidine  15 mL Mouth Rinse BID  . clindamycin (CLEOCIN) IV  600 mg Intravenous 3 times per day  . docusate sodium  100 mg Oral BID  . nicotine  21 mg Transdermal Daily   Continuous Infusions:   PRN Meds:.acetaminophen **OR** acetaminophen,  ALPRAZolam, alum & mag hydroxide-simeth, HYDROcodone-acetaminophen, morphine injection, ondansetron **OR** ondansetron (ZOFRAN) IV    PHYSICAL EXAM: Vital signs in last 24 hours: Filed Vitals:   01/15/15 0531 01/15/15 1349 01/15/15 2112 01/16/15 0531  BP: 137/86 133/86 123/86 123/77  Pulse: 85 78 67 82  Temp: 98.2 F (36.8 C) 98.2 F (36.8 C) 97.8 F (36.6 C) 98.1 F (36.7 C)  TempSrc: Oral Oral Oral Oral  Resp: 18 18 18 18   Height:      Weight:      SpO2: 97% 97% 99% 98%    Weight change:  Filed Weights   01/14/15 2159  Weight: 70.7 kg (155 lb 13.8 oz)   Body mass index is 24.41 kg/(m^2).   Gen Exam: Awake and alert with clear speech.  Significantly decreased facial swelling this morning.  Neck: Supple, No JVD.   Chest: B/L Clear.   CVS: S1 S2 Regular, no murmurs.  Abdomen: soft, BS +, non tender, non distended.  Extremities: no edema, lower extremities warm to touch. Neurologic: Non Focal.   Skin: No Rash.   Wounds: N/A.    Intake/Output from previous day:  Intake/Output Summary (Last 24 hours) at 01/16/15 1259 Last data filed at 01/16/15 0603  Gross per 24 hour  Intake 2332.33 ml  Output    800 ml  Net 1532.33 ml     LAB RESULTS: CBC  Recent Labs Lab 01/14/15 1724 01/14/15 1750 01/15/15 0735 01/16/15 0532  WBC 14.3*  --  9.1 6.5  HGB 13.7 15.3* 13.0 11.6*  HCT 40.7 45.0 38.1  34.8*  PLT 262  --  240 206  MCV 92.7  --  92.3 93.5  MCH 31.2  --  31.5 31.2  MCHC 33.7  --  34.1 33.3  RDW 13.5  --  13.5 13.4  LYMPHSABS 1.6  --   --   --   MONOABS 0.9  --   --   --   EOSABS 0.1  --   --   --   BASOSABS 0.0  --   --   --     Chemistries   Recent Labs Lab 01/14/15 1750 01/15/15 0735 01/16/15 0532  NA 138 137 140  K 3.2* 3.3* 3.5  CL 99 103 107  CO2  --  27 26  GLUCOSE 115* 128* 102*  BUN 9 8 <5*  CREATININE 0.60 0.59 0.62  CALCIUM  --  8.6 8.5    CBG: No results for input(s): GLUCAP in the last 168 hours.  GFR Estimated Creatinine  Clearance: 97.3 mL/min (by C-G formula based on Cr of 0.62).  Coagulation profile No results for input(s): INR, PROTIME in the last 168 hours.  Cardiac Enzymes No results for input(s): CKMB, TROPONINI, MYOGLOBIN in the last 168 hours.  Invalid input(s): CK  Invalid input(s): POCBNP No results for input(s): DDIMER in the last 72 hours. No results for input(s): HGBA1C in the last 72 hours. No results for input(s): CHOL, HDL, LDLCALC, TRIG, CHOLHDL, LDLDIRECT in the last 72 hours. No results for input(s): TSH, T4TOTAL, T3FREE, THYROIDAB in the last 72 hours.  Invalid input(s): FREET3 No results for input(s): VITAMINB12, FOLATE, FERRITIN, TIBC, IRON, RETICCTPCT in the last 72 hours. No results for input(s): LIPASE, AMYLASE in the last 72 hours.  Urine Studies No results for input(s): UHGB, CRYS in the last 72 hours.  Invalid input(s): UACOL, UAPR, USPG, UPH, UTP, UGL, UKET, UBIL, UNIT, UROB, ULEU, UEPI, UWBC, URBC, UBAC, CAST, UCOM, BILUA  MICROBIOLOGY: Recent Results (from the past 240 hour(s))  Culture, blood (routine x 2)     Status: None (Preliminary result)   Collection Time: 01/14/15  7:39 PM  Result Value Ref Range Status   Specimen Description BLOOD ARM RIGHT  Final   Special Requests   Final    BOTTLES DRAWN AEROBIC AND ANAEROBIC 10CCBLUE 5CCRED   Culture   Final           BLOOD CULTURE RECEIVED NO GROWTH TO DATE CULTURE WILL BE HELD FOR 5 DAYS BEFORE ISSUING A FINAL NEGATIVE REPORT Performed at [facility]    Report Status PENDING  Incomplete  Culture, blood (routine x 2)     Status: None (Preliminary result)   Collection Time: 01/14/15  7:47 PM  Result Value Ref Range Status   Specimen Description BLOOD HAND RIGHT  Final   Special Requests BOTTLES DRAWN AEROBIC ONLY 10CC  Final   Culture   Final           BLOOD CULTURE RECEIVED NO GROWTH TO DATE CULTURE WILL BE HELD FOR 5 DAYS BEFORE ISSUING A FINAL NEGATIVE REPORT Performed at [facility]     Report Status PENDING  Incomplete    RADIOLOGY STUDIES/RESULTS: Mr Jeri Cos Wo Contrast  01/14/2015   EXAM: MRI HEAD WITHOUT AND WITH CONTRAST  TECHNIQUE: Multiplanar, multiecho pulse sequences of the brain and surrounding structures were obtained without and with intravenous contrast.  CONTRAST:  16 cc of MultiHance.  COMPARISON:  Prior study performed earlier on the same day.  FINDINGS: Study is  degraded by motion artifact.  Cerebral volume within normal limits for patient age. No focal parenchymal signal abnormality or significant white matter changes present.  No abnormal foci of restricted diffusion to suggest acute intracranial infarct. Gray-white matter differentiation maintained. Normal intravascular flow voids are present.  There is a lobulated well-circumscribed mass located within the suprasellar region extending anteriorly along the planum sphenoidale. This mass demonstrates isointense precontrast T1 signal intensity, hypo T2 signal intensity, with avid post-contrast homogeneous enhancement. The mass measures 2.3 x 1.9 x 1.9 cm (transverse by AP by craniocaudad). This lesion is most compatible with a in meningioma. No significant edema. The lesion partially encompasses and encases the cavernous left ICA, with approximately 180 degrees of encasement (series 19, image 17). The anterior cerebral arteries are slightly displaced posterior very by this lesion. The pituitary immediately inferior to the mass appears normal. Pituitary stalk is grossly normal a midline, although is somewhat poorly evaluated as it is abuts the posterior margin of the mass. Optic chiasm remains normally positioned.  No other mass lesion or abnormal enhancement.  No hydrocephalus.  No extra-axial fluid collection.  Craniocervical junction is normal. No acute abnormality seen about the orbits.  Mild mucoperiosteal thickening present within the maxillary sinuses and ethmoidal air cells. Scattered fluid signal intensity present  within the right mastoid air cells.  Bone marrow signal intensity is normal. No scalp soft tissue abnormality.  IMPRESSION: 1. 2.3 x 1.9 x 1.9 cm suprasellar mass as above, most compatible with a meningioma. Correlation with MRA would likely be helpful prior to any potential future planned surgical intervention, as this lesion is intimately associated with the cavernous left ICA as well as the anterior cerebral arteries posteriorly. 2. No other acute intracranial process.   Electronically Signed   By: Jeannine Boga M.D.   On: 01/14/2015 22:16   Ct Maxillofacial W/cm  01/14/2015   CLINICAL DATA:  Facial swelling and redness, no known injury, initial encounter  EXAM: CT MAXILLOFACIAL WITH CONTRAST  TECHNIQUE: Multidetector CT imaging of the maxillofacial structures was performed with intravenous contrast. Multiplanar CT image reconstructions were also generated. A small metallic BB was placed on the right temple in order to reliably differentiate right from left.  CONTRAST:  179mL OMNIPAQUE IOHEXOL 300 MG/ML  SOLN  COMPARISON:  None.  FINDINGS: No acute fracture is identified. In the area of clinical concern below the nose, there is a multiloculated fluid attenuation area with peripheral enhancement and localized inflammatory change consistent with small subcutaneous abscess. No significant lymphadenopathy is identified. Symmetrical small lymph nodes are noted within the neck bilaterally. The carotid bifurcations are patent bilaterally. The parotid and submandibular glands are within normal limits. The orbits and their contents are within normal limits.  Skull base and its contents reveal an enhancing mass lesion along the superior aspect of the sella turcica and extending anteriorly. This measures 2.0 x 1.6 x 1.2 cm in greatest AP, transverse and craniocaudad dimensions. It is difficult is separate from the enhancing pituitary. It displaces the left anterior cerebral artery towards the right. Additionally  some very mild bony reaction is noted along the undersurface of the lesion. These changes may represent a meningioma arising along the planum sphenoidale. Additional considerations would include pituitary adenoma and less likely aneurysm. MRI is recommended for further evaluation. No other focal abnormality is noted.  IMPRESSION: Multiloculated enhancing fluid attenuation lesion along the inferior aspect of the nose and upper lip consistent with a focal abscess. Surrounding inflammatory changes are noted. This  is consistent with the patient's given clinical history.  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 Pituitary adenoma and less likely aneurysm are also considered. Pre and post-contrast MRI is recommended for further evaluation.  These results will be called to the ordering clinician or representative by the Radiologist Assistant, and communication documented in the PACS or zVision Dashboard.   Electronically Signed   By: Inez Catalina M.D.   On: 01/14/2015 16:40    Oren Binet, MD  [facility] Pager:[phone]  If 7PM-7AM, please contact night-coverage [email or web address] Password [email or web address] 01/16/2015, 12:59 PM   LOS: 2 days

## 2015-01-17 MED ORDER — ALPRAZOLAM 1 MG PO TABS
1.0000 mg | ORAL_TABLET | Freq: Three times a day (TID) | ORAL | Status: DC | PRN
Start: 2015-01-17 — End: 2019-03-29

## 2015-01-17 MED ORDER — HYDROCODONE-ACETAMINOPHEN 5-325 MG PO TABS: 1.0000 | ORAL_TABLET | Freq: Four times a day (QID) | ORAL | Status: DC | PRN

## 2015-01-17 MED ORDER — CLINDAMYCIN HCL 300 MG PO CAPS: 300.0000 mg | ORAL_CAPSULE | Freq: Four times a day (QID) | ORAL | Status: DC

## 2015-01-17 MED ORDER — NICOTINE 21 MG/24HR TD PT24: 21.0000 mg | MEDICATED_PATCH | Freq: Every day | TRANSDERMAL | Status: AC

## 2015-01-17 NOTE — Discharge Summary (Signed)
PATIENT DETAILS Name: Leslie Middleton Age: 34 y.o. Sex: female Date of Birth: 01/25/1981 MRN: [id number]. Admitting Physician: Elmarie Shiley, MD PCP:No primary care provider on file.  Admit Date: 2015-02-06 Discharge date: 01/17/2015  Recommendations for Outpatient Follow-up:  1. Ensure follow up with Neurosurgery for evaluation of Meningioma  PRIMARY DISCHARGE DIAGNOSIS:  Principal Problem:   Facial abscess      PAST MEDICAL HISTORY: Past Medical History  Diagnosis Date  . Anxiety   . Chronic back pain     DISCHARGE MEDICATIONS: Current Discharge Medication List    START taking these medications   Details  ALPRAZolam (XANAX) 1 MG tablet Take 1 tablet (1 mg total) by mouth 3 (three) times daily as needed for anxiety. Refills: 0    clindamycin (CLEOCIN) 300 MG capsule Take 1 capsule (300 mg total) by mouth 4 (four) times daily. Qty: 40 capsule, Refills: 0    HYDROcodone-acetaminophen (NORCO/VICODIN) 5-325 MG per tablet Take 1-2 tablets by mouth every 6 (six) hours as needed for moderate pain. Qty: 30 tablet, Refills: 0    nicotine (NICODERM CQ - DOSED IN MG/24 HOURS) 21 mg/24hr patch Place 1 patch (21 mg total) onto the skin daily. Qty: 28 patch, Refills: 0      STOP taking these medications     amphetamine-dextroamphetamine (ADDERALL) 10 MG tablet         ALLERGIES:   Allergies  Allergen Reactions  . Geodon [Ziprasidone Hcl]     BRIEF HPI:  See H&P, Labs, Consult and Test reports for all details in brief, patient is a 34 y.o. female with PMH significant for chronic back and anxiety who presents complaining of facial pain that started 3 days prior to admission. Found to have facial abscess and admitted for further treatment.  CONSULTATIONS:   ENT  PERTINENT RADIOLOGIC STUDIES: Mr Jeri Cos Wo Contrast  02-06-15   EXAM: MRI HEAD WITHOUT AND WITH CONTRAST  TECHNIQUE: Multiplanar, multiecho pulse sequences of the brain and surrounding structures were  obtained without and with intravenous contrast.  CONTRAST:  16 cc of MultiHance.  COMPARISON:  Prior study performed earlier on the same day.  FINDINGS: Study is degraded by motion artifact.  Cerebral volume within normal limits for patient age. No focal parenchymal signal abnormality or significant white matter changes present.  No abnormal foci of restricted diffusion to suggest acute intracranial infarct. Gray-white matter differentiation maintained. Normal intravascular flow voids are present.  There is a lobulated well-circumscribed mass located within the suprasellar region extending anteriorly along the planum sphenoidale. This mass demonstrates isointense precontrast T1 signal intensity, hypo T2 signal intensity, with avid post-contrast homogeneous enhancement. The mass measures 2.3 x 1.9 x 1.9 cm (transverse by AP by craniocaudad). This lesion is most compatible with a in meningioma. No significant edema. The lesion partially encompasses and encases the cavernous left ICA, with approximately 180 degrees of encasement (series 19, image 17). The anterior cerebral arteries are slightly displaced posterior very by this lesion. The pituitary immediately inferior to the mass appears normal. Pituitary stalk is grossly normal a midline, although is somewhat poorly evaluated as it is abuts the posterior margin of the mass. Optic chiasm remains normally positioned.  No other mass lesion or abnormal enhancement.  No hydrocephalus.  No extra-axial fluid collection.  Craniocervical junction is normal. No acute abnormality seen about the orbits.  Mild mucoperiosteal thickening present within the maxillary sinuses and ethmoidal air cells. Scattered fluid signal intensity present within the right mastoid air  cells.  Bone marrow signal intensity is normal. No scalp soft tissue abnormality.  IMPRESSION: 1. 2.3 x 1.9 x 1.9 cm suprasellar mass as above, most compatible with a meningioma. Correlation with MRA would likely be  helpful prior to any potential future planned surgical intervention, as this lesion is intimately associated with the cavernous left ICA as well as the anterior cerebral arteries posteriorly. 2. No other acute intracranial process.   Electronically Signed   By: Jeannine Boga M.D.   On: 01/14/2015 22:16   Ct Maxillofacial W/cm  01/14/2015   CLINICAL DATA:  Facial swelling and redness, no known injury, initial encounter  EXAM: CT MAXILLOFACIAL WITH CONTRAST  TECHNIQUE: Multidetector CT imaging of the maxillofacial structures was performed with intravenous contrast. Multiplanar CT image reconstructions were also generated. A small metallic BB was placed on the right temple in order to reliably differentiate right from left.  CONTRAST:  131mL OMNIPAQUE IOHEXOL 300 MG/ML  SOLN  COMPARISON:  None.  FINDINGS: No acute fracture is identified. In the area of clinical concern below the nose, there is a multiloculated fluid attenuation area with peripheral enhancement and localized inflammatory change consistent with small subcutaneous abscess. No significant lymphadenopathy is identified. Symmetrical small lymph nodes are noted within the neck bilaterally. The carotid bifurcations are patent bilaterally. The parotid and submandibular glands are within normal limits. The orbits and their contents are within normal limits.  Skull base and its contents reveal an enhancing mass lesion along the superior aspect of the sella turcica and extending anteriorly. This measures 2.0 x 1.6 x 1.2 cm in greatest AP, transverse and craniocaudad dimensions. It is difficult is separate from the enhancing pituitary. It displaces the left anterior cerebral artery towards the right. Additionally some very mild bony reaction is noted along the undersurface of the lesion. These changes may represent a meningioma arising along the planum sphenoidale. Additional considerations would include pituitary adenoma and less likely aneurysm. MRI is  recommended for further evaluation. No other focal abnormality is noted.  IMPRESSION: Multiloculated enhancing fluid attenuation lesion along the inferior aspect of the nose and upper lip consistent with a focal abscess. Surrounding inflammatory changes are noted. This is consistent with the patient's given clinical history.  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 Pituitary adenoma and less likely aneurysm are also considered. Pre and post-contrast MRI is recommended for further evaluation.  These results will be called to the ordering clinician or representative by the Radiologist Assistant, and communication documented in the PACS or zVision Dashboard.   Electronically Signed   By: Inez Catalina M.D.   On: 01/14/2015 16:40     PERTINENT LAB RESULTS: CBC:  Recent Labs  01/15/15 0735 01/16/15 0532  WBC 9.1 6.5  HGB 13.0 11.6*  HCT 38.1 34.8*  PLT 240 206   CMET CMP     Component Value Date/Time   NA 140 01/16/2015 0532   K 3.5 01/16/2015 0532   CL 107 01/16/2015 0532   CO2 26 01/16/2015 0532   GLUCOSE 102* 01/16/2015 0532   BUN <5* 01/16/2015 0532   CREATININE 0.62 01/16/2015 0532   CALCIUM 8.5 01/16/2015 0532   PROT 6.1 05/07/2007 0615   ALBUMIN 3.4* 05/07/2007 0615   AST 88* 05/07/2007 0615   ALT 100* 05/07/2007 0615   ALKPHOS 57 05/07/2007 0615   BILITOT 0.5 05/07/2007 0615   GFRNONAA >90 01/16/2015 0532   GFRAA >90 01/16/2015 0532    GFR Estimated Creatinine Clearance:  97.3 mL/min (by C-G formula based on Cr of 0.62). No results for input(s): LIPASE, AMYLASE in the last 72 hours. No results for input(s): CKTOTAL, CKMB, CKMBINDEX, TROPONINI in the last 72 hours. Invalid input(s): POCBNP No results for input(s): DDIMER in the last 72 hours. No results for input(s): HGBA1C in the last 72 hours. No results for input(s): CHOL, HDL, LDLCALC, TRIG, CHOLHDL, LDLDIRECT in the last 72 hours. No results for input(s): TSH, T4TOTAL,  T3FREE, THYROIDAB in the last 72 hours.  Invalid input(s): FREET3 No results for input(s): VITAMINB12, FOLATE, FERRITIN, TIBC, IRON, RETICCTPCT in the last 72 hours. Coags: No results for input(s): INR in the last 72 hours.  Invalid input(s): PT Microbiology: Recent Results (from the past 240 hour(s))  Culture, blood (routine x 2)     Status: None (Preliminary result)   Collection Time: 01/14/15  7:39 PM  Result Value Ref Range Status   Specimen Description BLOOD ARM RIGHT  Final   Special Requests   Final    BOTTLES DRAWN AEROBIC AND ANAEROBIC 10CCBLUE 5CCRED   Culture   Final           BLOOD CULTURE RECEIVED NO GROWTH TO DATE CULTURE WILL BE HELD FOR 5 DAYS BEFORE ISSUING A FINAL NEGATIVE REPORT Performed at [facility]    Report Status PENDING  Incomplete  Culture, blood (routine x 2)     Status: None (Preliminary result)   Collection Time: 01/14/15  7:47 PM  Result Value Ref Range Status   Specimen Description BLOOD HAND RIGHT  Final   Special Requests BOTTLES DRAWN AEROBIC ONLY 10CC  Final   Culture   Final           BLOOD CULTURE RECEIVED NO GROWTH TO DATE CULTURE WILL BE HELD FOR 5 DAYS BEFORE ISSUING A FINAL NEGATIVE REPORT Performed at [facility]    Report Status PENDING  Incomplete     BRIEF HOSPITAL COURSE:  Facial abscess: Admitted and started on intravenous clindamycin. CT of the maxillofacial region does confirm multiple small abscesses. Per ENT, no need for incision and drainage. SENT consulted. Recommendations were to manage medically, abscess were felt to be very small. Thankfully with Abx significantly improved, facial swelling has completely resolved Has some minimal swelling in the anterior nares. ENT recommending to continue with Clindamycin for 10 days on discharge. I have asked patient to follow up with Dr Benjamine Mola on discharge.   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 MRI images reviewed with Dr. Annette Stable,  recommendations for outpatient follow-up with neurosurgery upon discharge. I have asked patient to call Neurosurgery next week for appointment. Have placed Tel no for Dr Marchelle Folks office in [place].    Anxiety: Continue as needed Xanax.   Hypokalemia: Resolved   TODAY-DAY OF DISCHARGE:  Subjective:   Eriona Kinchen today has no headache,no chest abdominal pain,no new weakness tingling or numbness, feels much better wants to go home today.   Objective:   Blood pressure 120/81, pulse 92, temperature 98.3 F (36.8 C), temperature source Oral, resp. rate 18, height 5\' 7"  (1.702 m), weight 70.7 kg (155 lb 13.8 oz), last menstrual period 12/19/2014, SpO2 96 %.  Intake/Output Summary (Last 24 hours) at 01/17/15 1036 Last data filed at 01/16/15 2157  Gross per 24 hour  Intake    400 ml  Output      0 ml  Net    400 ml   Filed Weights   01/14/15 2159  Weight: 70.7  kg (155 lb 13.8 oz)    Exam Awake Alert, Oriented *3, No new F.N deficits, Normal affect [place].AT,PERRAL Supple Neck,No JVD, No cervical lymphadenopathy appriciated.  Symmetrical Chest wall movement, Good air movement bilaterally, CTAB RRR,No Gallops,Rubs or new Murmurs, No Parasternal Heave +ve B.Sounds, Abd Soft, Non tender, No organomegaly appriciated, No rebound -guarding or rigidity. No Cyanosis, Clubbing or edema, No new Rash or bruise  DISCHARGE CONDITION: Stable  DISPOSITION: Home  DISCHARGE INSTRUCTIONS:    Activity:  As tolerated with Full fall precautions use walker/cane & assistance as needed  Diet recommendation: Regular Diet  Discharge Instructions    Call MD for:  redness, tenderness, or signs of infection (pain, swelling, redness, odor or green/yellow discharge around incision site)    Complete by:  As directed      Diet general    Complete by:  As directed      Increase activity slowly    Complete by:  As directed            Follow-up Information    Follow up with Ascencion Dike, MD. Schedule an  appointment as soon as possible for a visit in 1 week.   Specialty:  Otolaryngology   Contact information:   [address] [place] [address] [phone]       Follow up with POOL,HENRY A, MD. Schedule an appointment as soon as possible for a visit in 2 weeks.   Specialty:  Neurosurgery   Contact information:   [address]  [place] [address] [phone]       Total Time spent on discharge equals 45 minutes.  SignedOren Binet 01/17/2015 10:36 AM

## 2015-01-17 NOTE — Progress Notes (Signed)
NURSING PROGRESS NOTE  Leslie Middleton [id number] Discharge Data: 01/17/2015 12:34 PM Attending Provider: Jonetta Osgood, MD PCP:No primary care provider on file.     [employer] to be D/C'd Home per MD order.  Discussed with the patient the After Visit Summary and all questions fully answered. All IV's discontinued with no bleeding noted. All belongings returned to patient for patient to take home.   Last Vital Signs:  Blood pressure 120/81, pulse 92, temperature 98.3 F (36.8 C), temperature source Oral, resp. rate 18, height 5\' 7"  (1.702 m), weight 70.7 kg (155 lb 13.8 oz), last menstrual period 12/19/2014, SpO2 96 %.  Discharge Medication List   Medication List    STOP taking these medications        amphetamine-dextroamphetamine 10 MG tablet  Commonly known as:  ADDERALL      TAKE these medications        ALPRAZolam 1 MG tablet  Commonly known as:  XANAX  Take 1 tablet (1 mg total) by mouth 3 (three) times daily as needed for anxiety.     clindamycin 300 MG capsule  Commonly known as:  CLEOCIN  Take 1 capsule (300 mg total) by mouth 4 (four) times daily.     HYDROcodone-acetaminophen 5-325 MG per tablet  Commonly known as:  NORCO/VICODIN  Take 1-2 tablets by mouth every 6 (six) hours as needed for moderate pain.     nicotine 21 mg/24hr patch  Commonly known as:  NICODERM CQ - dosed in mg/24 hours  Place 1 patch (21 mg total) onto the skin daily.         Wallie Renshaw, RN

## 2015-01-17 NOTE — Progress Notes (Signed)
Subjective: Pt feeling much better.    Objective: Vital signs in last 24 hours: Temp:  [97.7 F (36.5 C)-98.3 F (36.8 C)] 98.3 F (36.8 C) (03/12 0528) Pulse Rate:  [89-96] 92 (03/12 0528) Resp:  [18-22] 18 (03/12 0528) BP: (115-127)/(80-84) 120/81 mmHg (03/12 0528) SpO2:  [96 %-98 %] 96 % (03/12 0528)  Physical Exam  Constitutional: She is oriented to person, place, and time. She appears well-developed and well-nourished.  Head: Normocephalic, atraumatic. Eyes: EOM are normal. PERRL. Ears: Normal auricles and EACs. Nose: She has mild tenderness to palpation and edema of the nasal septum and upper lip.  She has no dental or gum pain.  Neck: Neck supple. No tracheal deviation present. No significant LAD. Skin: Skin is warm and dry.  Psychiatric: She has a normal mood and affect. Her behavior is normal.    Recent Labs  01/15/15 0735 01/16/15 0532  WBC 9.1 6.5  HGB 13.0 11.6*  HCT 38.1 34.8*  PLT 240 206    Recent Labs  01/15/15 0735 01/16/15 0532  NA 137 140  K 3.3* 3.5  CL 103 107  CO2 27 26  GLUCOSE 128* 102*  BUN 8 <5*  CREATININE 0.59 0.62  CALCIUM 8.6 8.5    Medications:  I have reviewed the patient's current medications. Scheduled: . amphetamine-dextroamphetamine  20 mg Oral Daily  . antiseptic oral rinse  7 mL Mouth Rinse q12n4p  . clindamycin (CLEOCIN) IV  600 mg Intravenous 3 times per day  . docusate sodium  100 mg Oral BID  . enoxaparin (LOVENOX) injection  40 mg Subcutaneous Q24H  . nicotine  21 mg Transdermal Daily    Assessment/Plan: Facial/nasal cellulitis and abscess. Clinically improved. WBC decreased and normalized.  Pt to f/u in my office next week after d/c'ed home.    LOS: 3 days   Farha Dano,SUI W 01/17/2015, 1:12 PM

## 2015-01-21 LAB — CULTURE, BLOOD (ROUTINE X 2)
CULTURE: NO GROWTH
Culture: NO GROWTH

## 2019-03-25 ENCOUNTER — Encounter (HOSPITAL_COMMUNITY): Payer: Self-pay | Admitting: *Deleted

## 2019-03-25 ENCOUNTER — Inpatient Hospital Stay (HOSPITAL_COMMUNITY)
Admission: AD | Admit: 2019-03-25 | Discharge: 2019-03-29 | DRG: 885 | Disposition: A | Discharge status: Home / Self Care | Payer: Medicare Other | Source: Intra-hospital | Attending: Psychiatry | Admitting: Psychiatry

## 2019-03-25 ENCOUNTER — Other Ambulatory Visit: Payer: Self-pay

## 2019-03-25 DIAGNOSIS — F1910 Other psychoactive substance abuse, uncomplicated: Secondary | ICD-10-CM

## 2019-03-25 DIAGNOSIS — F1510 Other stimulant abuse, uncomplicated: Secondary | ICD-10-CM | POA: Diagnosis present

## 2019-03-25 DIAGNOSIS — F1310 Sedative, hypnotic or anxiolytic abuse, uncomplicated: Secondary | ICD-10-CM | POA: Diagnosis present

## 2019-03-25 DIAGNOSIS — F1410 Cocaine abuse, uncomplicated: Secondary | ICD-10-CM | POA: Diagnosis present

## 2019-03-25 DIAGNOSIS — G4700 Insomnia, unspecified: Secondary | ICD-10-CM | POA: Diagnosis present

## 2019-03-25 DIAGNOSIS — F319 Bipolar disorder, unspecified: Secondary | ICD-10-CM | POA: Diagnosis present

## 2019-03-25 DIAGNOSIS — F17210 Nicotine dependence, cigarettes, uncomplicated: Secondary | ICD-10-CM | POA: Diagnosis present

## 2019-03-25 DIAGNOSIS — F1210 Cannabis abuse, uncomplicated: Secondary | ICD-10-CM | POA: Diagnosis present

## 2019-03-25 DIAGNOSIS — F312 Bipolar disorder, current episode manic severe with psychotic features: Secondary | ICD-10-CM | POA: Diagnosis not present

## 2019-03-25 DIAGNOSIS — R443 Hallucinations, unspecified: Secondary | ICD-10-CM | POA: Diagnosis present

## 2019-03-25 MED ORDER — HYDROXYZINE HCL 25 MG PO TABS: 25.0000 mg | ORAL_TABLET | Freq: Three times a day (TID) | ORAL | Status: DC | PRN

## 2019-03-25 MED ORDER — MAGNESIUM HYDROXIDE 400 MG/5ML PO SUSP: 30.0000 mL | Freq: Every day | ORAL | Status: DC | PRN

## 2019-03-25 MED ORDER — ACETAMINOPHEN 325 MG PO TABS
650.0000 mg | ORAL_TABLET | Freq: Four times a day (QID) | ORAL | Status: DC | PRN
  Administered 2019-03-26 – 2019-03-28 (×2): 650 mg via ORAL
  Filled 2019-03-25 (×2): qty 2

## 2019-03-25 MED ORDER — ALUM & MAG HYDROXIDE-SIMETH 200-200-20 MG/5ML PO SUSP: 30.0000 mL | ORAL | Status: DC | PRN

## 2019-03-25 MED ORDER — TRAZODONE HCL 50 MG PO TABS: 50.0000 mg | ORAL_TABLET | Freq: Every evening | ORAL | Status: DC | PRN

## 2019-03-25 NOTE — Tx Team (Signed)
Initial Treatment Plan 03/25/2019 6:34 PM [name] EYC:[id number]    PATIENT STRESSORS: Substance abuse   PATIENT STRENGTHS: Communication skills General fund of knowledge Physical Health   PATIENT IDENTIFIED PROBLEMS: Anxiety    Substance abuse                   DISCHARGE CRITERIA:  Improved stabilization in mood, thinking, and/or behavior Motivation to continue treatment in a less acute level of care Need for constant or close observation no longer present Withdrawal symptoms are absent or subacute and managed without 24-hour nursing intervention  PRELIMINARY DISCHARGE PLAN: Outpatient therapy  PATIENT/FAMILY INVOLVEMENT: This treatment plan has been presented to and reviewed with the patient, [name], and/or family member.  The patient and family have been given the opportunity to ask questions and make suggestions.  Judie Petit, RN 03/25/2019, 6:34 PM
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
## 2019-03-26 DIAGNOSIS — F312 Bipolar disorder, current episode manic severe with psychotic features: Secondary | ICD-10-CM

## 2019-03-26 MED ORDER — BENZTROPINE MESYLATE 0.5 MG PO TABS
0.5000 mg | ORAL_TABLET | Freq: Two times a day (BID) | ORAL | Status: AC
  Administered 2019-03-26 – 2019-03-27 (×4): 0.5 mg via ORAL
  Filled 2019-03-26 (×6): qty 1

## 2019-03-26 MED ORDER — NICOTINE POLACRILEX 2 MG MT GUM
2.0000 mg | CHEWING_GUM | OROMUCOSAL | Status: DC | PRN
  Administered 2019-03-26 – 2019-03-29 (×9): 2 mg via ORAL
  Filled 2019-03-26 (×2): qty 1

## 2019-03-26 MED ORDER — TEMAZEPAM 15 MG PO CAPS
30.0000 mg | ORAL_CAPSULE | Freq: Every day | ORAL | Status: DC
  Administered 2019-03-26 – 2019-03-28 (×3): 30 mg via ORAL
  Filled 2019-03-26 (×3): qty 2

## 2019-03-26 MED ORDER — RISPERIDONE 3 MG PO TABS
3.0000 mg | ORAL_TABLET | Freq: Two times a day (BID) | ORAL | Status: DC
  Administered 2019-03-26 – 2019-03-27 (×3): 3 mg via ORAL
  Filled 2019-03-26: qty 1
  Filled 2019-03-26: qty 3
  Filled 2019-03-26 (×4): qty 1

## 2019-03-26 MED ORDER — LITHIUM CARBONATE ER 450 MG PO TBCR
450.0000 mg | EXTENDED_RELEASE_TABLET | Freq: Two times a day (BID) | ORAL | Status: DC
  Administered 2019-03-26 – 2019-03-27 (×3): 450 mg via ORAL
  Filled 2019-03-26 (×6): qty 1

## 2019-03-26 NOTE — BHH Suicide Risk Assessment (Signed)
[facility] INPATIENT:  Family/Significant Other Suicide Prevention Education  Suicide Prevention Education:  Patient Refusal for Family/Significant Other Suicide Prevention Education: The patient Leslie Middleton has refused to provide written consent for family/significant other to be provided Family/Significant Other Suicide Prevention Education during admission and/or prior to discharge.  Physician notified. Pt reports that she does not have anyone for CSW to contact. Pt denied the SPE.   Trecia Rogers 03/26/2019, 2:24 PM
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
## 2019-03-26 NOTE — Progress Notes (Signed)
Patient ID: Leslie Middleton, female   DOB: [birth year], 38 y.o.   MRN: [id number]  D: Patient has been sleeping since shift change. No regular medications scheduled tonight. Previous nurse reported patient to be disorganized and pacing when first getting on the unit. Staff will let her sleep due to hx of mania and recent substance use. A: Staff will monitor on q 15 minute checks tonight. R: Patient continues to be sleeping when checking on her tonight.

## 2019-03-26 NOTE — Plan of Care (Signed)
Progress note  Pt found in bed; compliant with medication administration. Pt has complaints of hyperactivity, grandeur, and a sinus headache. 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 Pt is redirectable and pleasant. Pt denies any physical problems. Pt states she doesn't like taking lithium, but said she would try this again. Education provided. Pt denies si/hi/ah/vh and verbally agrees to approach staff if these become apparent or before harming herself/others while at [facility]. Pt provided support and encouragement pt given medication per protocol and standing orders. Q53m safety checks implemented and continued. Will continue to monitor.   Pt progressing in the following metrics  Problem: Education: Goal: Ability to state activities that reduce stress will improve Outcome: Progressing   Problem: Coping: Goal: Ability to identify and develop effective coping behavior will improve Outcome: Progressing   Problem: Self-Concept: Goal: Ability to identify factors that promote anxiety will improve Outcome: Progressing Goal: Level of anxiety will decrease Outcome: Progressing
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
## 2019-03-27 MED ORDER — OLANZAPINE 10 MG PO TABS
20.0000 mg | ORAL_TABLET | Freq: Every day | ORAL | Status: DC
  Administered 2019-03-27: 20 mg via ORAL
  Filled 2019-03-27 (×2): qty 2

## 2019-03-27 MED ORDER — CARBAMAZEPINE 100 MG PO CHEW
100.0000 mg | CHEWABLE_TABLET | Freq: Three times a day (TID) | ORAL | Status: DC
  Administered 2019-03-27 – 2019-03-28 (×3): 100 mg via ORAL
  Filled 2019-03-27 (×6): qty 1

## 2019-03-27 NOTE — Progress Notes (Signed)
[facility] MD Progress Note  03/27/2019 9:20 AM Leslie Middleton  MRN:  [id number] Subjective:   Patient is no longer in a full-blown manic episode she denies auditory and visual hallucinations she still tends to minimize her presentation but at the same time she is less giddy less silly today and showing a good bit of insight states that her daughter was put on Zyprexa and did very well and the patient herself would like to replace her current mood stabilizer therapy/Risperdal and lithium, with Zyprexa and explained to her that it would work better with lithium and/or another mood stabilizer so we picked Tegretol so she is showing some good insight and she denies wanting to harm self or others she has no substances in her system that require detox Principal Problem: Bipolar/psychotic episode/history of polysubstance abuse Diagnosis: Active Problems:   Bipolar disorder, unspecified ([place])  Total Time spent with patient: 20 minutes  Past Psychiatric History: Extensive episodes and substance abuse as mentioned to involved past amphetamine, benzodiazepine, opiate and cannabis abuse  Past Medical History:  Past Medical History:  Diagnosis Date  . Anxiety   . Chronic back pain     Past Surgical History:  Procedure Laterality Date  . CHOLECYSTECTOMY    . TUBAL LIGATION     Family History: History reviewed. No pertinent family history. Family Psychiatric  History: States her daughter is responding well to olanzapine Social History:  Social History   Substance and Sexual Activity  Alcohol Use Yes   Comment: 6 pack 2xweek     Social History   Substance and Sexual Activity  Drug Use Not Currently   Comment: occassionally    Social History   Socioeconomic History  . Marital status: Divorced    Spouse name: Not on file  . Number of children: Not on file  . Years of education: Not on file  . Highest education level: Not on file  Occupational History  . Not on file  Social Needs  .  Financial resource strain: Not on file  . Food insecurity:    Worry: Not on file    Inability: Not on file  . Transportation needs:    Medical: Not on file    Non-medical: Not on file  Tobacco Use  . Smoking status: Current Every Day Smoker    Packs/day: 0.25    Years: 20.00    Pack years: 5.00    Types: Cigarettes  . Smokeless tobacco: Never Used  Substance and Sexual Activity  . Alcohol use: Yes    Comment: 6 pack 2xweek  . Drug use: Not Currently    Comment: occassionally  . Sexual activity: Yes    Birth control/protection: None  Lifestyle  . Physical activity:    Days per week: Not on file    Minutes per session: Not on file  . Stress: Not on file  Relationships  . Social connections:    Talks on phone: Not on file    Gets together: Not on file    Attends religious service: Not on file    Active member of club or organization: Not on file    Attends meetings of clubs or organizations: Not on file    Relationship status: Not on file  Other Topics Concern  . Not on file  Social History Narrative  . Not on file   Additional Social History:    Pain Medications: See MARs Prescriptions: See MARs Over the Counter: See MARs History of alcohol / drug use?:  Yes Longest period of sobriety (when/how long): unknown Negative Consequences of Use: Personal relationships Name of Substance 1: Alcohol 1 - Age of First Use: unknown 1 - Amount (size/oz): 6-beers 1 - Frequency: unknown 1 - Duration: unknown 1 - Last Use / Amount: PTA                  Sleep: Good  Appetite:  Good  Current Medications: Current Facility-Administered Medications  Medication Dose Route Frequency Provider Last Rate Last Dose  . acetaminophen (TYLENOL) tablet 650 mg  650 mg Oral Q6H PRN Money, Lowry Ram, FNP   650 mg at 03/26/19 1417  . alum & mag hydroxide-simeth (MAALOX/MYLANTA) 200-200-20 MG/5ML suspension 30 mL  30 mL Oral Q4H PRN Money, Darnelle Maffucci B, FNP      . benztropine (COGENTIN)  tablet 0.5 mg  0.5 mg Oral BID Johnn Hai, MD   0.5 mg at 03/27/19 0815  . carbamazepine (TEGRETOL) chewable tablet 100 mg  100 mg Oral TID Johnn Hai, MD      . hydrOXYzine (ATARAX/VISTARIL) tablet 25 mg  25 mg Oral TID PRN Money, Lowry Ram, FNP      . magnesium hydroxide (MILK OF MAGNESIA) suspension 30 mL  30 mL Oral Daily PRN Money, Darnelle Maffucci B, FNP      . nicotine polacrilex (NICORETTE) gum 2 mg  2 mg Oral PRN Sharma Covert, MD   2 mg at 03/27/19 0816  . OLANZapine (ZYPREXA) tablet 20 mg  20 mg Oral QHS Johnn Hai, MD      . temazepam (RESTORIL) capsule 30 mg  30 mg Oral QHS Johnn Hai, MD   30 mg at 03/26/19 2122  . traZODone (DESYREL) tablet 50 mg  50 mg Oral QHS PRN Money, Lowry Ram, FNP        Lab Results: No results found for this or any previous visit (from the past 48 hour(s)).  Blood Alcohol level:  No results found for: [facility]  Metabolic Disorder Labs: No results found for: HGBA1C, MPG No results found for: PROLACTIN No results found for: CHOL, TRIG, HDL, CHOLHDL, VLDL, LDLCALC  Physical Findings: AIMS:  , ,  ,  ,    CIWA:    COWS:     Musculoskeletal: Strength & Muscle Tone: within normal limits Gait & Station: normal Patient leans: N/A  Psychiatric Specialty Exam: Physical Exam  ROS  Blood pressure 111/68, pulse 91, temperature 98.4 F (36.9 C), temperature source Oral, resp. rate 18.There is no height or weight on file to calculate BMI.  General Appearance: Casual  Eye Contact:  Good  Speech:  Clear and Coherent  Volume:  Normal  Mood:  hypomanic-more serious less silly  Affect:  Congruent  Thought Process:  Linear and Descriptions of Associations: Tangential  Orientation:  Full (Time, Place, and Person)  Thought Content:  Rumination and Tangential  Suicidal Thoughts:  No  Homicidal Thoughts:  No  Memory:  Immediate;   Fair  Judgement:  Fair  Insight:  Fair  Psychomotor Activity:  Normal  Concentration:  Concentration: Fair  Recall:  [employer] of Knowledge:  Fair  Language:  Fair  Akathisia:  Negative  Handed:  Right  AIMS (if indicated):     Assets:  Physical Health Resilience Social Support  ADL's:  Intact  Cognition:  WNL  Sleep:  Number of Hours: 6.75     Treatment Plan Summary: Daily contact with patient to assess and evaluate symptoms and progress in treatment, Medication  management and Plan Continue current reality-based and cognitive-based therapies but we will go ahead and place her lithium and Risperdal with Zyprexa and Tegretol at her request monitor another 24 to 48 hours while these get a steady state or at least approach steady state  Cordelle Dahmen, MD 03/27/2019, 9:20 AM

## 2019-03-27 NOTE — Progress Notes (Signed)
[facility] Notes:  (Nursing/MHT/Case Management/Adjunct)  Date:  03/27/2019  Time:  2030  Type of Therapy:  wrap up group  Participation Level:  Active  Participation Quality:  Attentive, Monopolizing, Redirectable, Sharing and Supportive  Affect:  Appropriate  Cognitive:  Confused  Insight:  Appropriate and Improving  Engagement in Group:  Supportive  Modes of Intervention:  Clarification, Education and Support  Summary of Progress/Problems: Pt shares that she is grateful for her three kids and her dodson. Pt reports conflict with her mother whom she loves but reports mother jealous of her. Pt shares she can only distance herself so much from her mother because the kids and dog spend more time with her mother so if she wants to see them she has to see her. Pt was curious about discharge and if patients had to be discharged into the care of another adult.   Shellia Cleverly 03/27/2019, 9:37 PM

## 2019-03-27 NOTE — Progress Notes (Signed)
Patient ID: Leslie Middleton, female   DOB: [birth year], 38 y.o.   MRN: [id number]   D: Patient pleasant on approach. Some disorganization in her thoughts when talking at times but able to answer questions accurately tonight. Reviewed medications with her and went over fall precaution information about sitting and standing slowly when starting new medications especially since she was put on temazepam tonight. Denies any hallucinations or issues tonight. No active SI. A: Staff will monitor on q 15 minute checks, follow treatment plan, and give medication as ordered. R: Cooperative on the unit.

## 2019-03-27 NOTE — Progress Notes (Signed)
Recreation Therapy Notes  Date:  5.20.20 Time: 0930 Location: [address] Dayroom  Group Topic: Stress Management  Goal Area(s) Addresses:  Patient will identify positive stress management techniques. Patient will identify benefits of using stress management post d/c.  Behavioral Response: Engaged  Intervention: Stress Management  Activity :  [employer].  LRT played a meditation that focused on taking on the stillness and resilience of mountain.  Patients were to listen as the meditation played to fully engage in the activity.    Education:  Stress Management, Discharge Planning.   Education Outcome: Acknowledges Education  Clinical Observations/Feedback:  Pt attended and participated in group.    Victorino Sparrow, LRT/CTRS   Ria Comment, Reinhardt Licausi A 03/27/2019 10:59 AM

## 2019-03-27 NOTE — Progress Notes (Signed)
D:  Patient is visible in the dayroom with minimal amount of interaction with others.  She is pleasant upon approach and denies any thoughts of hurting herself or others.  She took medications without any issues and had no complaints of discomfort.  A:  Medications administered as ordered.  Safety checks q 15 minutes. Emotional support provided.  R:  Safety maintained on unit.

## 2019-03-27 NOTE — Plan of Care (Addendum)
Patient self inventory- Patient slept well last night, sleep medication was helpful. Depression, hopelessness, and anxiety rated 5, 0, 8 out of 10. Denies SI HI AVH. Endorses cravings, headaches, and back pain 6/10. Patient's goal is "getting better and work on breathing and solutions."  Patient is compliant with medications. No side effects noted. Safety is maintained with 15 minute checks. Will continue to monitor and provide support.  Problem: Education: Goal: Ability to state activities that reduce stress will improve Outcome: Progressing   Problem: Coping: Goal: Ability to identify and develop effective coping behavior will improve Outcome: Progressing   Problem: Self-Concept: Goal: Ability to identify factors that promote anxiety will improve Outcome: Progressing Goal: Level of anxiety will decrease Outcome: Progressing Goal: Ability to modify response to factors that promote anxiety will improve Outcome: Progressing

## 2019-03-27 NOTE — Progress Notes (Signed)
[place] NOVEL CORONAVIRUS (COVID-19) DAILY CHECK-OFF SYMPTOMS - answer yes or no to each - every day NO YES  Have you had a fever in the past 24 hours?  . Fever (Temp > 37.80C / 100F) X   Have you had any of these symptoms in the past 24 hours? . New Cough .  Sore Throat  .  Shortness of Breath .  Difficulty Breathing .  Unexplained Body Aches   X   Have you had any one of these symptoms in the past 24 hours not related to allergies?   . Runny Nose .  Nasal Congestion .  Sneezing   X   If you have had runny nose, nasal congestion, sneezing in the past 24 hours, has it worsened?  X   EXPOSURES - check yes or no X   Have you traveled outside the state in the past 14 days?  X   Have you been in contact with someone with a confirmed diagnosis of COVID-19 or PUI in the past 14 days without wearing appropriate PPE?  X   Have you been living in the same home as a person with confirmed diagnosis of COVID-19 or a PUI (household contact)?    X   Have you been diagnosed with COVID-19?    X              What to do next: Answered NO to all: Answered YES to anything:   Proceed with unit schedule Follow the [facility] Inpatient Flowsheet.   

## 2019-03-27 NOTE — Tx Team (Signed)
Interdisciplinary Treatment and Diagnostic Plan Update  03/27/2019 Time of Session: 9:23am Leslie Middleton MRN: [id number]  Principal Diagnosis: <principal problem not specified>  Secondary Diagnoses: Active Problems:   Bipolar disorder, unspecified ([place])   Current Medications:  Current Facility-Administered Medications  Medication Dose Route Frequency Provider Last Rate Last Dose  . acetaminophen (TYLENOL) tablet 650 mg  650 mg Oral Q6H PRN Money, Lowry Ram, FNP   650 mg at 03/26/19 1417  . alum & mag hydroxide-simeth (MAALOX/MYLANTA) 200-200-20 MG/5ML suspension 30 mL  30 mL Oral Q4H PRN Money, Darnelle Maffucci B, FNP      . benztropine (COGENTIN) tablet 0.5 mg  0.5 mg Oral BID Johnn Hai, MD   0.5 mg at 03/27/19 0815  . carbamazepine (TEGRETOL) chewable tablet 100 mg  100 mg Oral TID Johnn Hai, MD      . hydrOXYzine (ATARAX/VISTARIL) tablet 25 mg  25 mg Oral TID PRN Money, Lowry Ram, FNP      . magnesium hydroxide (MILK OF MAGNESIA) suspension 30 mL  30 mL Oral Daily PRN Money, Darnelle Maffucci B, FNP      . nicotine polacrilex (NICORETTE) gum 2 mg  2 mg Oral PRN Sharma Covert, MD   2 mg at 03/27/19 0816  . OLANZapine (ZYPREXA) tablet 20 mg  20 mg Oral QHS Johnn Hai, MD      . temazepam (RESTORIL) capsule 30 mg  30 mg Oral QHS Johnn Hai, MD   30 mg at 03/26/19 2122  . traZODone (DESYREL) tablet 50 mg  50 mg Oral QHS PRN Money, Lowry Ram, FNP       PTA Medications: Medications Prior to Admission  Medication Sig Dispense Refill Last Dose  . ALPRAZolam (XANAX) 1 MG tablet Take 1 tablet (1 mg total) by mouth 3 (three) times daily as needed for anxiety. (Patient not taking: Reported on 03/26/2019)  0 Not Taking at Unknown time  . clindamycin (CLEOCIN) 300 MG capsule Take 1 capsule (300 mg total) by mouth 4 (four) times daily. (Patient not taking: Reported on 03/26/2019) 40 capsule 0 Completed Course at Unknown time  . escitalopram (LEXAPRO) 10 MG tablet Take 10 mg by mouth daily.   Not Taking at  Unknown time  . guanFACINE (INTUNIV) 2 MG TB24 ER tablet Take 2 mg by mouth daily.   Not Taking at Unknown time  . HYDROcodone-acetaminophen (NORCO/VICODIN) 5-325 MG per tablet Take 1-2 tablets by mouth every 6 (six) hours as needed for moderate pain. (Patient not taking: Reported on 03/26/2019) 30 tablet 0 Completed Course at Unknown time  . hydrOXYzine (ATARAX/VISTARIL) 25 MG tablet Take 25 mg by mouth 4 (four) times daily as needed for anxiety.    Not Taking at Unknown time  . lithium carbonate 300 MG capsule Take 300 mg by mouth 2 (two) times daily with a meal.   Not Taking at Unknown time  . nicotine (NICODERM CQ - DOSED IN MG/24 HOURS) 21 mg/24hr patch Place 1 patch (21 mg total) onto the skin daily. (Patient not taking: Reported on 03/26/2019) 28 patch 0 Completed Course at Unknown time  . risperiDONE (RISPERDAL) 0.5 MG tablet Take 0.5 mg by mouth 2 (two) times daily.   Not Taking at Unknown time    Patient Stressors: Substance abuse  Patient Strengths: Curator fund of knowledge Physical Health  Treatment Modalities: Medication Management, Group therapy, Case management,  1 to 1 session with clinician, Psychoeducation, Recreational therapy.   Physician Treatment Plan for Primary Diagnosis: <principal problem  not specified> Long Term Goal(s): Improvement in symptoms so as ready for discharge Improvement in symptoms so as ready for discharge   Short Term Goals: Ability to demonstrate self-control will improve Ability to identify and develop effective coping behaviors will improve Ability to maintain clinical measurements within normal limits will improve Ability to disclose and discuss suicidal ideas Ability to identify and develop effective coping behaviors will improve  Medication Management: Evaluate patient's response, side effects, and tolerance of medication regimen.  Therapeutic Interventions: 1 to 1 sessions, Unit Group sessions and Medication  administration.  Evaluation of Outcomes: Progressing  Physician Treatment Plan for Secondary Diagnosis: Active Problems:   Bipolar disorder, unspecified ([place])  Long Term Goal(s): Improvement in symptoms so as ready for discharge Improvement in symptoms so as ready for discharge   Short Term Goals: Ability to demonstrate self-control will improve Ability to identify and develop effective coping behaviors will improve Ability to maintain clinical measurements within normal limits will improve Ability to disclose and discuss suicidal ideas Ability to identify and develop effective coping behaviors will improve     Medication Management: Evaluate patient's response, side effects, and tolerance of medication regimen.  Therapeutic Interventions: 1 to 1 sessions, Unit Group sessions and Medication administration.  Evaluation of Outcomes: Progressing   RN Treatment Plan for Primary Diagnosis: <principal problem not specified> Long Term Goal(s): Knowledge of disease and therapeutic regimen to maintain health will improve  Short Term Goals: Ability to participate in decision making will improve, Ability to verbalize feelings will improve, Ability to disclose and discuss suicidal ideas, Ability to identify and develop effective coping behaviors will improve and Compliance with prescribed medications will improve  Medication Management: RN will administer medications as ordered by provider, will assess and evaluate patient's response and provide education to patient for prescribed medication. RN will report any adverse and/or side effects to prescribing provider.  Therapeutic Interventions: 1 on 1 counseling sessions, Psychoeducation, Medication administration, Evaluate responses to treatment, Monitor vital signs and CBGs as ordered, Perform/monitor CIWA, COWS, AIMS and Fall Risk screenings as ordered, Perform wound care treatments as ordered.  Evaluation of Outcomes: Progressing   LCSW  Treatment Plan for Primary Diagnosis: <principal problem not specified> Long Term Goal(s): Safe transition to appropriate next level of care at discharge, Engage patient in therapeutic group addressing interpersonal concerns.  Short Term Goals: Engage patient in aftercare planning with referrals and resources and Increase skills for wellness and recovery  Therapeutic Interventions: Assess for all discharge needs, 1 to 1 time with Social worker, Explore available resources and support systems, Assess for adequacy in community support network, Educate family and significant other(s) on suicide prevention, Complete Psychosocial Assessment, Interpersonal group therapy.  Evaluation of Outcomes: Progressing   Progress in Treatment: Attending groups: No. Participating in groups: No. Taking medication as prescribed: Yes. Toleration medication: Yes. Family/Significant other contact made: No, will contact:  pt denied; spe with pt Patient understands diagnosis: Yes. Discussing patient identified problems/goals with staff: Yes. Medical problems stabilized or resolved: Yes. Denies suicidal/homicidal ideation: Yes. Issues/concerns per patient self-inventory: No. Other:   New problem(s) identified: No, Describe:  None  New Short Term/Long Term Goal(s):  Patient Goals:  "I want to get on the right medications so that I can get better"  Discharge Plan or Barriers:   Reason for Continuation of Hospitalization: Mania Medication stabilization  Estimated Length of Stay: 1-2 days  Attendees: Patient: 03/27/2019  Physician: Dr. Johnn Hai, MD 03/27/2019   Nursing: Yetta Flock, RN 03/27/2019  RN  Care Manager: 03/27/2019   Social Worker: Ardelle Anton, [place] 03/27/2019   Recreational Therapist:  03/27/2019   Other:  03/27/2019   Other:  03/27/2019   Other: 03/27/2019     Scribe for Treatment Team: Trecia Rogers, LCSW 03/27/2019 9:37 AM

## 2019-03-27 NOTE — [facility] Group Notes (Signed)
Occupational Therapy Group Note  Date:  03/27/2019 Time:  1:30 PM  Group Topic/Focus:  Leisure  Participation Level:  Active  Participation Quality:  Redirectable  Affect:  Not Congruent  Cognitive:  Alert  Insight: Lacking  Engagement in Group:  Engaged  Modes of Intervention:  Activity, Discussion, Education and Socialization  Additional Comments:    S: "I like to make people laugh to help situations"  O: OT group focus on leisure this date, while incorporating coping skills to ensure understanding. Pts to play game of Uno: and name a struggle they're facing, a communication skill, something they like about yourself, stress management, and a healthy way to manage anger per certain cards played. Pt also assessed for attention, ability to follow rules, and temperament with rules.   A: Pt presents with incongruent affect, making some bizarre statements and singing bizarre songs. She is redirectable well with cues. She was sharing how she likes to make people laugh to help manage stress, then began talking about a time she was walking outside with no clothes on. Pt needing cues to maintain attention to activity, but overall pleasant.  P: OT group will be x1 per week while pt inpatient.   Zenovia Jarred, MSOT, OTR/L Behavioral Health OT/ Acute Relief OT PHP Office: [place] 03/27/2019, 1:30 PM

## 2019-03-28 MED ORDER — OLANZAPINE 7.5 MG PO TABS
15.0000 mg | ORAL_TABLET | Freq: Every day | ORAL | Status: DC
  Administered 2019-03-28: 21:00:00 15 mg via ORAL
  Filled 2019-03-28 (×3): qty 2

## 2019-03-28 MED ORDER — GABAPENTIN 300 MG PO CAPS
300.0000 mg | ORAL_CAPSULE | Freq: Three times a day (TID) | ORAL | Status: DC
  Administered 2019-03-28 – 2019-03-29 (×3): 300 mg via ORAL
  Filled 2019-03-28 (×8): qty 1

## 2019-03-28 MED ORDER — CARBAMAZEPINE 100 MG PO CHEW
200.0000 mg | CHEWABLE_TABLET | Freq: Every day | ORAL | Status: DC
  Filled 2019-03-28: qty 2

## 2019-03-28 NOTE — Plan of Care (Addendum)
Patient self inventory- Patient slept well last night, sleep medication was not requested (it was given). Appetite is good, energy level high, and concentration good. Depression, hopelessness, and anxiety rated 7, 4, 5 out of 10. Denies SI HI AVH. Denies physical pain and problems. Patient reports pain level 7/10 in back and legs. Patient's goal is "getting better and focus on breathing." Patient is compliant with medications. No side effects noted.  Safety is maintained with 15 minute checks as well as environmental checks. Will continue to monitor and provide support.  Problem: Education: Goal: Ability to state activities that reduce stress will improve Outcome: Progressing   Problem: Coping: Goal: Ability to identify and develop effective coping behavior will improve Outcome: Progressing   Problem: Self-Concept: Goal: Ability to identify factors that promote anxiety will improve Outcome: Progressing Goal: Level of anxiety will decrease Outcome: Progressing Goal: Ability to modify response to factors that promote anxiety will improve Outcome: Progressing

## 2019-03-28 NOTE — [facility] Group Notes (Signed)
[facility] LCSW Group Therapy Note  Date/Time: 03/28/2019 @ 1:30pm  Type of Therapy/Topic:  Group Therapy:  Feelings about Diagnosis  Participation Level:  Active   Mood: Pleasant   Description of Group:    This group will allow patients to explore their thoughts and feelings about diagnoses they have received. Patients will be guided to explore their level of understanding and acceptance of these diagnoses. Facilitator will encourage patients to process their thoughts and feelings about the reactions of others to their diagnosis, and will guide patients in identifying ways to discuss their diagnosis with significant others in their lives. This group will be process-oriented, with patients participating in exploration of their own experiences as well as giving and receiving support and challenge from other group members.   Therapeutic Goals: 1. Patient will demonstrate understanding of diagnosis as evidence by identifying two or more symptoms of the disorder:  2. Patient will be able to express two feelings regarding the diagnosis 3. Patient will demonstrate ability to communicate their needs through discussion and/or role plays  Summary of Patient Progress:   Pt did attend and engage throughout group. Pt was able to discuss her feelings around her mental health diagnosis. Pt stated that she has always gotten diagnosed with substance abuse diagnoses and she does not agree with that. Pt reports that she just uses substances occassionally and feels that she needs more of a mental health diagnosis. Pt opened up how she copes with her diagnosis and was able to use appropriate dialogue with her fellow group mates.       Therapeutic Modalities:   Cognitive Behavioral Therapy Brief Therapy Feelings Identification   Ardelle Anton, LCSW

## 2019-03-28 NOTE — Progress Notes (Signed)
[facility] MD Progress Note  03/28/2019 8:56 AM Leslie Middleton  MRN:  [id number] Subjective:   Patient no longer in a manic or disorganized state she is alert and oriented and cooperative and oriented except the exact date she request a slight reduction in medications now in order to have less sedation and less dizziness-and she specifically request Neurontin because that keeps her from drinking by her report at least helps decrease the cravings though she is not drinking every day at home.  Principal Problem: Mania/psychosis/history of substance abuse Diagnosis: Active Problems:   Bipolar disorder, unspecified ([place])  Total Time spent with patient: 20 minutes  Past Psychiatric History: As discussed previously history of polysubstance abuse but also an underlying bipolar type condition with recent episode of mania disorganized behavior and socially/public disruptive behaviors  Past Medical History:  Past Medical History:  Diagnosis Date  . Anxiety   . Chronic back pain     Past Surgical History:  Procedure Laterality Date  . CHOLECYSTECTOMY    . TUBAL LIGATION     Family History: History reviewed. No pertinent family history. Family Psychiatric  History: Again patient has elaborated her daughter did well with the combination of meds she is currently on Social History:  Social History   Substance and Sexual Activity  Alcohol Use Yes   Comment: 6 pack 2xweek     Social History   Substance and Sexual Activity  Drug Use Not Currently   Comment: occassionally    Social History   Socioeconomic History  . Marital status: Divorced    Spouse name: Not on file  . Number of children: Not on file  . Years of education: Not on file  . Highest education level: Not on file  Occupational History  . Not on file  Social Needs  . Financial resource strain: Not on file  . Food insecurity:    Worry: Not on file    Inability: Not on file  . Transportation needs:    Medical: Not on file    Non-medical: Not on file  Tobacco Use  . Smoking status: Current Every Day Smoker    Packs/day: 0.25    Years: 20.00    Pack years: 5.00    Types: Cigarettes  . Smokeless tobacco: Never Used  Substance and Sexual Activity  . Alcohol use: Yes    Comment: 6 pack 2xweek  . Drug use: Not Currently    Comment: occassionally  . Sexual activity: Yes    Birth control/protection: None  Lifestyle  . Physical activity:    Days per week: Not on file    Minutes per session: Not on file  . Stress: Not on file  Relationships  . Social connections:    Talks on phone: Not on file    Gets together: Not on file    Attends religious service: Not on file    Active member of club or organization: Not on file    Attends meetings of clubs or organizations: Not on file    Relationship status: Not on file  Other Topics Concern  . Not on file  Social History Narrative  . Not on file   Additional Social History:    Pain Medications: See MARs Prescriptions: See MARs Over the Counter: See MARs History of alcohol / drug use?: Yes Longest period of sobriety (when/how long): unknown Negative Consequences of Use: Personal relationships Name of Substance 1: Alcohol 1 - Age of First Use: unknown 1 - Amount (size/oz): 6-beers  1 - Frequency: unknown 1 - Duration: unknown 1 - Last Use / Amount: PTA                  Sleep: Good  Appetite:  Good  Current Medications: Current Facility-Administered Medications  Medication Dose Route Frequency Provider Last Rate Last Dose  . acetaminophen (TYLENOL) tablet 650 mg  650 mg Oral Q6H PRN Money, Lowry Ram, FNP   650 mg at 03/26/19 1417  . alum & mag hydroxide-simeth (MAALOX/MYLANTA) 200-200-20 MG/5ML suspension 30 mL  30 mL Oral Q4H PRN Money, Lowry Ram, FNP      . carbamazepine (TEGRETOL) chewable tablet 100 mg  100 mg Oral TID Johnn Hai, MD   100 mg at 03/28/19 0732  . hydrOXYzine (ATARAX/VISTARIL) tablet 25 mg  25 mg Oral TID PRN Money, Lowry Ram, FNP      . magnesium hydroxide (MILK OF MAGNESIA) suspension 30 mL  30 mL Oral Daily PRN Money, Darnelle Maffucci B, FNP      . nicotine polacrilex (NICORETTE) gum 2 mg  2 mg Oral PRN Sharma Covert, MD   2 mg at 03/27/19 1714  . OLANZapine (ZYPREXA) tablet 20 mg  20 mg Oral QHS Johnn Hai, MD   20 mg at 03/27/19 2103  . temazepam (RESTORIL) capsule 30 mg  30 mg Oral QHS Johnn Hai, MD   30 mg at 03/27/19 2103  . traZODone (DESYREL) tablet 50 mg  50 mg Oral QHS PRN Money, Lowry Ram, FNP        Lab Results: No results found for this or any previous visit (from the past 48 hour(s)).  Blood Alcohol level:  No results found for: [facility]  Metabolic Disorder Labs: No results found for: HGBA1C, MPG No results found for: PROLACTIN No results found for: CHOL, TRIG, HDL, CHOLHDL, VLDL, LDLCALC  Physical Findings: AIMS:  , ,  ,  ,    CIWA:    COWS:     Musculoskeletal: Strength & Muscle Tone: within normal limits Gait & Station: normal Psychiatric Specialty Exam: Physical Exam  ROS  Blood pressure 104/79, pulse 96, temperature (!) 97.5 F (36.4 C), temperature source Oral, resp. rate 18.There is no height or weight on file to calculate BMI.  General Appearance: Disheveled  Eye Contact:  Fair  Speech:  Clear and Coherent  Volume:  Decreased  Mood:  Dysphoric  Affect:  Congruent  Thought Process:  Goal Directed  Orientation:  Full (Time, Place, and Person)  Thought Content:  Logical and Rumination  Suicidal Thoughts:  No  Homicidal Thoughts:  No  Memory:  Immediate;   Fair  Judgement:  Fair  Insight:  Fair  Psychomotor Activity:  Normal  Concentration:  Concentration: Fair  Recall:  [employer] of Knowledge:  Fair  Language:  Fair  Akathisia:  Negative  Handed:  Right  AIMS (if indicated):     Assets:  Leisure Time Physical Health Resilience Social Support  ADL's:  Intact  Cognition:  WNL  Sleep:  Number of Hours: 5.25     Treatment Plan Summary: Daily contact with  patient to assess and evaluate symptoms and progress in treatment, Medication management and Plan Med adjustments to include reduction of olanzapine dosing and addition of Neurontin continue cognitive-based work rehab based work probable discharge tomorrow  Johnn Hai, MD 03/28/2019, 8:56 AM

## 2019-03-28 NOTE — Progress Notes (Signed)
Adult Psychoeducational Group Note  Date:  03/28/2019 Time:  8:40 PM  Group Topic/Focus:    Participation Level:  Active  Participation Quality:  Appropriate  Affect:  Appropriate  Cognitive:  Appropriate  Insight: Appropriate  Engagement in Group:  Engaged  Modes of Intervention:  Discussion  Additional Comments:  Pt stated her goal was to work on her discharge plan.  Pt stated goal was met.  Pt stated her meds were fixed and that mad her happy.  Pt rated the day at 8/10.  Brynnly Bonet 03/28/2019, 8:40 PM

## 2019-03-28 NOTE — Progress Notes (Signed)
D: Pt was in dayroom upon initial approach.  Pt presents with appropriate affect and anxious mood.  She paces the hallway at times.  She reports her day was "all right."  Her goal is "pushing forward to go home.  I discharge tomorrow."  Pt reports she feels safe to discharge.  Pt denies SI/HI, denies hallucinations, reports bilateral leg pain 9/10.  Pt has been visible in milieu interacting with peers and staff appropriately.    A: Introduced self to pt.  Met with pt 1:1.  Actively listened to pt and offered support and encouragement.  Medications administered per order.  PRN medication administered for pain.  Q15 minute safety checks in place.  R: Pt is safe on the unit.  Pt is compliant with medications.  Pt verbally contracts for safety.  Will continue to monitor and assess.

## 2019-03-29 DIAGNOSIS — F1910 Other psychoactive substance abuse, uncomplicated: Secondary | ICD-10-CM

## 2019-03-29 MED ORDER — GABAPENTIN 300 MG PO CAPS: 300.0000 mg | ORAL_CAPSULE | Freq: Three times a day (TID) | ORAL | 3 refills | Status: AC

## 2019-03-29 MED ORDER — OLANZAPINE 15 MG PO TABS: 15.0000 mg | ORAL_TABLET | Freq: Every day | ORAL | 1 refills | Status: AC

## 2019-03-29 MED ORDER — CARBAMAZEPINE 100 MG PO CHEW: 200.0000 mg | CHEWABLE_TABLET | Freq: Every day | ORAL | 1 refills | Status: AC
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
## 2019-03-29 NOTE — [facility] Suicide Risk Assessment (Signed)
[facility] Discharge Suicide Risk Assessment   Principal Problem: Bipolar manic with psychosis/history of polysubstance abuse/public disorganization and psychosis Discharge Diagnoses: Active Problems:   Bipolar disorder, unspecified ([place])   Total Time spent with patient: 45 minutes  Musculoskeletal: Strength & Muscle Tone: within normal limits Gait & Station: normal Psychiatric Specialty Exam: ROS  Blood pressure (!) 91/55, pulse 78, temperature 98.4 F (36.9 C), resp. rate 18.There is no height or weight on file to calculate BMI.  General Appearance: Casual  Eye Contact::  Good  Speech:  Clear and [id number]  Volume:  Normal  Mood:  Euthymic  Affect:  Congruent  Thought Process:  Coherent  Orientation:  Full (Time, Place, and Person)  Thought Content:  Logical and Tangential  Suicidal Thoughts:  No  Homicidal Thoughts:  No  Memory:  Immediate;   Fair  Judgement:  Intact  Insight:  Fair  Psychomotor Activity:  Normal  Concentration:  fair  Recall:  fair  Fund of Knowledge:Good  Language: Good  Akathisia:  Negative  Handed:  Right  AIMS (if indicated):     Assets:  Communication Skills Desire for Improvement Housing Leisure Time Physical Health Resilience  Sleep:  Number of Hours: 6.25  Cognition: WNL  ADL's:  Intact   Mental Status Per Nursing Assessment::   On Admission:  NA  Demographic Factors:  Female  Loss Factors: NA  Historical Factors: Impulsivity  Risk Reduction Factors:   Sense of responsibility to family and Religious beliefs about death  Continued Clinical Symptoms:  Bipolar Disorder:   Mixed State  Cognitive Features That Contribute To Risk:  Loss of executive function    Suicide Risk:  Minimal: No identifiable suicidal ideation.  Patients presenting with no risk factors but with morbid ruminations; may be classified as minimal risk based on the severity of the depressive symptoms  Follow-up Information    [facility] Follow up on 04/02/2019.    Why:  Hosppital follow up appointment is Tuesday, 5/26 at 11:00a. The appointment will be held over the phone and the provider will contact you.  Contact information: [address] [place] [address] [phone]           Plan Of Care/Follow-up recommendations:  Activity:  full  Jacey Pelc, MD 03/29/2019, 9:24 AM

## 2019-03-29 NOTE — Plan of Care (Signed)
  Problem: Self-Concept: Goal: Ability to identify factors that promote anxiety will improve Outcome: Progressing

## 2019-03-29 NOTE — Progress Notes (Signed)
   03/29/19 0308  COVID-19 Daily Checkoff  Have you had a fever (temp > 37.80C/100F)  in the past 24 hours?  No  COVID-19 EXPOSURE  Have you traveled outside the state in the past 14 days? No  Have you been in contact with someone with a confirmed diagnosis of COVID-19 or PUI in the past 14 days without wearing appropriate PPE? No  Have you been living in the same home as a person with confirmed diagnosis of COVID-19 or a PUI (household contact)? No  Have you been diagnosed with COVID-19? No

## 2019-03-29 NOTE — Progress Notes (Signed)
  [facility] Adult Case Management Discharge Plan :  Will you be returning to the same living situation after discharge:  Yes,  back home with friends At discharge, do you have transportation home?: Yes,  the bus Do you have the ability to pay for your medications: Yes,  Medicare  Release of information consent forms completed and in the chart;  Patient's signature needed at discharge.  Patient to Follow up at: Follow-up Information    [facility] Follow up on 04/02/2019.   Why:  Hosppital follow up appointment is Tuesday, 5/26 at 11:00a. The appointment will be held over the phone and the provider will contact you.  Contact information: [place] [address] [phone]           Next level of care provider has access to [place] and Suicide Prevention discussed: No.; pt denied consents but SPE with pt.      Has patient been referred to the Quitline?: Patient refused referral  Patient has been referred for addiction treatment: Yes  Trecia Rogers, LCSW 03/29/2019, 9:13 AM

## 2019-03-29 NOTE — Tx Team (Signed)
Interdisciplinary Treatment and Diagnostic Plan Update  03/29/2019 Time of Session: 9:00am Leslie Middleton MRN: [id number]  Principal Diagnosis: <principal problem not specified>  Secondary Diagnoses: Active Problems:   Bipolar disorder, unspecified ([place])   Current Medications:  Current Facility-Administered Medications  Medication Dose Route Frequency Provider Last Rate Last Dose  . acetaminophen (TYLENOL) tablet 650 mg  650 mg Oral Q6H PRN Money, Lowry Ram, FNP   650 mg at 03/28/19 2101  . alum & mag hydroxide-simeth (MAALOX/MYLANTA) 200-200-20 MG/5ML suspension 30 mL  30 mL Oral Q4H PRN Money, Lowry Ram, FNP      . carbamazepine (TEGRETOL) chewable tablet 200 mg  200 mg Oral QHS Johnn Hai, MD      . gabapentin (NEURONTIN) capsule 300 mg  300 mg Oral TID Johnn Hai, MD   300 mg at 03/29/19 0809  . hydrOXYzine (ATARAX/VISTARIL) tablet 25 mg  25 mg Oral TID PRN Money, Lowry Ram, FNP      . magnesium hydroxide (MILK OF MAGNESIA) suspension 30 mL  30 mL Oral Daily PRN Money, Darnelle Maffucci B, FNP      . nicotine polacrilex (NICORETTE) gum 2 mg  2 mg Oral PRN Sharma Covert, MD   2 mg at 03/29/19 0810  . OLANZapine (ZYPREXA) tablet 15 mg  15 mg Oral QHS Johnn Hai, MD   15 mg at 03/28/19 2100  . temazepam (RESTORIL) capsule 30 mg  30 mg Oral QHS Johnn Hai, MD   30 mg at 03/28/19 2100  . traZODone (DESYREL) tablet 50 mg  50 mg Oral QHS PRN Money, Lowry Ram, FNP       PTA Medications: Medications Prior to Admission  Medication Sig Dispense Refill Last Dose  . ALPRAZolam (XANAX) 1 MG tablet Take 1 tablet (1 mg total) by mouth 3 (three) times daily as needed for anxiety. (Patient not taking: Reported on 03/26/2019)  0 Not Taking at Unknown time  . clindamycin (CLEOCIN) 300 MG capsule Take 1 capsule (300 mg total) by mouth 4 (four) times daily. (Patient not taking: Reported on 03/26/2019) 40 capsule 0 Completed Course at Unknown time  . escitalopram (LEXAPRO) 10 MG tablet Take 10 mg by mouth  daily.   Not Taking at Unknown time  . guanFACINE (INTUNIV) 2 MG TB24 ER tablet Take 2 mg by mouth daily.   Not Taking at Unknown time  . HYDROcodone-acetaminophen (NORCO/VICODIN) 5-325 MG per tablet Take 1-2 tablets by mouth every 6 (six) hours as needed for moderate pain. (Patient not taking: Reported on 03/26/2019) 30 tablet 0 Completed Course at Unknown time  . hydrOXYzine (ATARAX/VISTARIL) 25 MG tablet Take 25 mg by mouth 4 (four) times daily as needed for anxiety.    Not Taking at Unknown time  . lithium carbonate 300 MG capsule Take 300 mg by mouth 2 (two) times daily with a meal.   Not Taking at Unknown time  . nicotine (NICODERM CQ - DOSED IN MG/24 HOURS) 21 mg/24hr patch Place 1 patch (21 mg total) onto the skin daily. (Patient not taking: Reported on 03/26/2019) 28 patch 0 Completed Course at Unknown time  . risperiDONE (RISPERDAL) 0.5 MG tablet Take 0.5 mg by mouth 2 (two) times daily.   Not Taking at Unknown time    Patient Stressors: Substance abuse  Patient Strengths: Curator fund of knowledge Physical Health  Treatment Modalities: Medication Management, Group therapy, Case management,  1 to 1 session with clinician, Psychoeducation, Recreational therapy.   Physician Treatment Plan for Primary  Diagnosis: <principal problem not specified> Long Term Goal(s): Improvement in symptoms so as ready for discharge Improvement in symptoms so as ready for discharge   Short Term Goals: Ability to demonstrate self-control will improve Ability to identify and develop effective coping behaviors will improve Ability to maintain clinical measurements within normal limits will improve Ability to disclose and discuss suicidal ideas Ability to identify and develop effective coping behaviors will improve  Medication Management: Evaluate patient's response, side effects, and tolerance of medication regimen.  Therapeutic Interventions: 1 to 1 sessions, Unit Group sessions  and Medication administration.  Evaluation of Outcomes: Adequate for Discharge  Physician Treatment Plan for Secondary Diagnosis: Active Problems:   Bipolar disorder, unspecified ([place])  Long Term Goal(s): Improvement in symptoms so as ready for discharge Improvement in symptoms so as ready for discharge   Short Term Goals: Ability to demonstrate self-control will improve Ability to identify and develop effective coping behaviors will improve Ability to maintain clinical measurements within normal limits will improve Ability to disclose and discuss suicidal ideas Ability to identify and develop effective coping behaviors will improve     Medication Management: Evaluate patient's response, side effects, and tolerance of medication regimen.  Therapeutic Interventions: 1 to 1 sessions, Unit Group sessions and Medication administration.  Evaluation of Outcomes: Adequate for Discharge   RN Treatment Plan for Primary Diagnosis: <principal problem not specified> Long Term Goal(s): Knowledge of disease and therapeutic regimen to maintain health will improve  Short Term Goals: Ability to participate in decision making will improve, Ability to verbalize feelings will improve, Ability to disclose and discuss suicidal ideas, Ability to identify and develop effective coping behaviors will improve and Compliance with prescribed medications will improve  Medication Management: RN will administer medications as ordered by provider, will assess and evaluate patient's response and provide education to patient for prescribed medication. RN will report any adverse and/or side effects to prescribing provider.  Therapeutic Interventions: 1 on 1 counseling sessions, Psychoeducation, Medication administration, Evaluate responses to treatment, Monitor vital signs and CBGs as ordered, Perform/monitor CIWA, COWS, AIMS and Fall Risk screenings as ordered, Perform wound care treatments as ordered.  Evaluation of  Outcomes: Adequate for Discharge   LCSW Treatment Plan for Primary Diagnosis: <principal problem not specified> Long Term Goal(s): Safe transition to appropriate next level of care at discharge, Engage patient in therapeutic group addressing interpersonal concerns.  Short Term Goals: Engage patient in aftercare planning with referrals and resources and Increase skills for wellness and recovery  Therapeutic Interventions: Assess for all discharge needs, 1 to 1 time with Social worker, Explore available resources and support systems, Assess for adequacy in community support network, Educate family and significant other(s) on suicide prevention, Complete Psychosocial Assessment, Interpersonal group therapy.  Evaluation of Outcomes: Adequate for Discharge   Progress in Treatment: Attending groups: Yes. Participating in groups: Yes. Taking medication as prescribed: Yes. Toleration medication: Yes. Family/Significant other contact made: No, will contact:  pt denied ; SPE with pt Patient understands diagnosis: Yes. Discussing patient identified problems/goals with staff: Yes. Medical problems stabilized or resolved: Yes. Denies suicidal/homicidal ideation: Yes. Issues/concerns per patient self-inventory: No. Other:   New problem(s) identified: No, Describe:  None  New Short Term/Long Term Goal(s):  Patient Goals:   "I want to get on the right medications so that I can get better"  Discharge Plan or Barriers:   Reason for Continuation of Hospitalization:  Estimated Length of Stay: Pt is discharging    Attendees: Patient: 03/29/2019  Physician:  Dr. Johnn Hai, MD 03/29/2019   Nursing: Chong Sicilian, RN 03/29/2019  RN Care Manager: 03/29/2019   Social Worker: Ardelle Anton, [place] 03/29/2019   Recreational Therapist:  03/29/2019   Other:  03/29/2019   Other:  03/29/2019   Other: 03/29/2019        Scribe for Treatment Team: Trecia Rogers, LCSW 03/29/2019 9:30 AM

## 2019-03-29 NOTE — Progress Notes (Signed)
[place] NOVEL CORONAVIRUS (COVID-19) DAILY CHECK-OFF SYMPTOMS - answer yes or no to each - every day NO YES  Have you had a fever in the past 24 hours?  . Fever (Temp > 37.80C / 100F) X   Have you had any of these symptoms in the past 24 hours? . New Cough .  Sore Throat  .  Shortness of Breath .  Difficulty Breathing .  Unexplained Body Aches   X   Have you had any one of these symptoms in the past 24 hours not related to allergies?   . Runny Nose .  Nasal Congestion .  Sneezing   X   If you have had runny nose, nasal congestion, sneezing in the past 24 hours, has it worsened?  X   EXPOSURES - check yes or no X   Have you traveled outside the state in the past 14 days?  X   Have you been in contact with someone with a confirmed diagnosis of COVID-19 or PUI in the past 14 days without wearing appropriate PPE?  X   Have you been living in the same home as a person with confirmed diagnosis of COVID-19 or a PUI (household contact)?    X   Have you been diagnosed with COVID-19?    X              What to do next: Answered NO to all: Answered YES to anything:   Proceed with unit schedule Follow the [facility] Inpatient Flowsheet.   

## 2019-03-29 NOTE — Progress Notes (Signed)
Recreation Therapy Notes  Date:  5.22.20 Time: 0930 Location: 300 Hall Dayroom  Group Topic: Stress Management  Goal Area(s) Addresses:  Patient will identify positive stress management techniques. Patient will identify benefits of using stress management post d/c.  Behavioral Response:  Engaged  Intervention:  Stress Management  Activity :  Progressive Muscle Relaxation.  LRT introduced the stress management technique of pmr.  LRT read a script to lead patients through tensing then relaxation each muscle individually.  Patients were to follow along as script was read to engage in activity.  Education:  Stress Management, Discharge Planning.   Education Outcome: Acknowledges Education  Clinical Observations/Feedback:  Pt attended and participated in group.    Victorino Sparrow, LRT/CTRS         Ria Comment, Takeisha Cianci A 03/29/2019 11:00 AM

## 2019-03-29 NOTE — Progress Notes (Signed)
Pt completed her daily assessment and on this she wrote she denied SI , rating her depression, hopelessness and anxeity " 0/0/0/", respectively. Her dc instrucitons are reviewed with her by this Probation officer and she stated verbal understanding and willingness to comply. All belongings are returned to her and pt was escorted to bldg entrnace and dc'd home- she arranged her own cab ride home. Pt dc'd per MD order.

## 2020-04-01 ENCOUNTER — Encounter (HOSPITAL_COMMUNITY): Payer: Self-pay | Admitting: Emergency Medicine

## 2020-04-01 DIAGNOSIS — F209 Schizophrenia, unspecified: Secondary | ICD-10-CM | POA: Insufficient documentation

## 2020-04-01 DIAGNOSIS — F17210 Nicotine dependence, cigarettes, uncomplicated: Secondary | ICD-10-CM | POA: Diagnosis not present

## 2020-04-01 DIAGNOSIS — F419 Anxiety disorder, unspecified: Secondary | ICD-10-CM | POA: Diagnosis not present

## 2020-04-01 DIAGNOSIS — F4310 Post-traumatic stress disorder, unspecified: Secondary | ICD-10-CM | POA: Diagnosis not present

## 2020-04-01 DIAGNOSIS — R441 Visual hallucinations: Secondary | ICD-10-CM | POA: Diagnosis present

## 2020-04-01 DIAGNOSIS — F319 Bipolar disorder, unspecified: Secondary | ICD-10-CM | POA: Insufficient documentation

## 2020-04-01 NOTE — ED Triage Notes (Signed)
Patient reports she has not been taking psych meds for "a while." Reports binge drinking x "a few days." States "I smoke marijuana because I have special permission from the government. I am an agent." Hx PTSD, anxiety, bipolar, depression. Denies SI/HI/AV/H.

## 2020-04-02 ENCOUNTER — Emergency Department (HOSPITAL_COMMUNITY)
Admission: EM | Admit: 2020-04-02 | Discharge: 2020-04-02 | Disposition: A | Discharge status: Home / Self Care | Payer: Medicare Other | Attending: Emergency Medicine | Admitting: Emergency Medicine

## 2020-04-02 DIAGNOSIS — K0889 Other specified disorders of teeth and supporting structures: Secondary | ICD-10-CM

## 2020-04-02 DIAGNOSIS — R443 Hallucinations, unspecified: Secondary | ICD-10-CM

## 2020-04-02 DIAGNOSIS — F419 Anxiety disorder, unspecified: Secondary | ICD-10-CM | POA: Diagnosis not present

## 2020-04-02 LAB — COMPREHENSIVE METABOLIC PANEL
ALT: 20 U/L (ref 0–44)
AST: 19 U/L (ref 15–41)
Albumin: 3.7 g/dL (ref 3.5–5.0)
Alkaline Phosphatase: 49 U/L (ref 38–126)
Anion gap: 12 (ref 5–15)
BUN: 9 mg/dL (ref 6–20)
CO2: 25 mmol/L (ref 22–32)
Calcium: 8.5 mg/dL — ABNORMAL LOW (ref 8.9–10.3)
Chloride: 101 mmol/L (ref 98–111)
Creatinine, Ser: 0.53 mg/dL (ref 0.44–1.00)
GFR calc Af Amer: >60 mL/min (ref >60)
GFR calc non Af Amer: >60 mL/min (ref >60)
Glucose, Bld: 97 mg/dL (ref 70–99)
Potassium: 3.4 mmol/L — ABNORMAL LOW (ref 3.5–5.1)
Sodium: 138 mmol/L (ref 135–145)
Total Bilirubin: 0.9 mg/dL (ref 0.3–1.2)
Total Protein: 6.5 g/dL (ref 6.5–8.1)

## 2020-04-02 LAB — CBC WITH DIFFERENTIAL/PLATELET
Abs Immature Granulocytes: 0.02 10*3/uL (ref 0.00–0.07)
Basophils Absolute: 0 10*3/uL (ref 0.0–0.1)
Basophils Relative: 1 %
Eosinophils Absolute: 0.3 10*3/uL (ref 0.0–0.5)
Eosinophils Relative: 7 %
HCT: 39 % (ref 36.0–46.0)
Hemoglobin: 13 g/dL (ref 12.0–15.0)
Immature Granulocytes: 0 %
Lymphocytes Relative: 35 %
Lymphs Abs: 1.7 10*3/uL (ref 0.7–4.0)
MCH: 31 pg (ref 26.0–34.0)
MCHC: 33.3 g/dL (ref 30.0–36.0)
MCV: 92.9 fL (ref 80.0–100.0)
Monocytes Absolute: 0.6 10*3/uL (ref 0.1–1.0)
Monocytes Relative: 11 %
Neutro Abs: 2.2 10*3/uL (ref 1.7–7.7)
Neutrophils Relative %: 46 %
Platelets: 237 10*3/uL (ref 150–400)
RBC: 4.2 MIL/uL (ref 3.87–5.11)
RDW: 12.5 % (ref 11.5–15.5)
WBC: 4.9 10*3/uL (ref 4.0–10.5)
nRBC: 0 % (ref 0.0–0.2)

## 2020-04-02 LAB — I-STAT BETA HCG BLOOD, ED (MC, WL, AP ONLY): I-stat hCG, quantitative: <5 m[IU]/mL (ref <5)

## 2020-04-02 LAB — SALICYLATE LEVEL: Salicylate Lvl: <7 mg/dL — ABNORMAL LOW (ref 7.0–30.0)

## 2020-04-02 LAB — ACETAMINOPHEN LEVEL: Acetaminophen (Tylenol), Serum: <10 ug/mL — ABNORMAL LOW (ref 10–30)

## 2020-04-02 LAB — ETHANOL: Alcohol, Ethyl (B): <10 mg/dL (ref <10)

## 2020-04-02 MED ORDER — ACETAMINOPHEN 325 MG PO TABS
650.0000 mg | ORAL_TABLET | Freq: Four times a day (QID) | ORAL | Status: DC | PRN
  Administered 2020-04-02: 650 mg via ORAL
  Filled 2020-04-02: qty 2

## 2020-04-02 MED ORDER — PSEUDOEPHEDRINE HCL 60 MG PO TABS
30.0000 mg | ORAL_TABLET | ORAL | Status: DC | PRN
  Administered 2020-04-02: 30 mg via ORAL
  Filled 2020-04-02: qty 1

## 2020-04-02 MED ORDER — LORATADINE 10 MG PO TABS
10.0000 mg | ORAL_TABLET | Freq: Every day | ORAL | Status: DC
  Administered 2020-04-02: 10 mg via ORAL
  Filled 2020-04-02: qty 1

## 2020-04-02 MED ORDER — IBUPROFEN 200 MG PO TABS
600.0000 mg | ORAL_TABLET | Freq: Four times a day (QID) | ORAL | Status: DC | PRN
  Administered 2020-04-02: 600 mg via ORAL
  Filled 2020-04-02: qty 3

## 2020-04-02 NOTE — BH Assessment (Signed)
Contacted nursing admins to make her aware that TTS is ready to see patient. Also, messaged patient's nurse to notiffy her that TTS is ready to complete patient's assessment. Per nursing staff patient will be moved to Triage.

## 2020-04-02 NOTE — Discharge Instructions (Addendum)
To help you maintain a sober lifestyle, a substance abuse treatment program may be beneficial to you.  Contact one of the following facilities at your earliest opportunity to ask about enrolling:  RESIDENTIAL PROGRAMS:       [facility]      [address]      [place], [place] [address]      [phone]  OUTPATIENT PROGRAMS:       [facility]      [place], [place] [address]      [phone]  For your shelter needs, you are advised to contact [facility] at your earliest opportunity:       [facility]      [phone]  For other supportive services, contact the [facility]:       [facility]      [place], [place] [address]      [phone]
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
## 2020-04-02 NOTE — Progress Notes (Signed)
TTS spoke with Caren Griffins, RN, advised TTS is ready to assess this pt. TTS called cart as instructed and attempted to assess the pt but the nurse tech placed the cart in the hallway next to the pt and the pt is not in a private room, pt still in the hallway. TTS advised staff due to confidentially reasons and HIPAA the pt cannot be assessed in the hallway and must be placed in a private room for the assessment, ED staff must call back to TTS when the pt is in a private room and able to complete the assessment.

## 2020-04-02 NOTE — BH Assessment (Signed)
[facility] Assessment Progress Note  Per Arsenio Katz, NP, this pt does not require psychiatric hospitalization at this time.  Pt is to be discharged from [facility] with referral information for area substance abuse treatment providers, and with information regarding area supportive services for the homeless.  This has been included in pt's discharge instructions.  Pt would also benefit from seeing Peer Support Specialists, and a peer support consult has been ordered for pt.  Pt's nurse has been notified.  Jalene Mullet, [place] Triage Specialist [phone]

## 2024-01-08 NOTE — Congregational Nurse Program (Signed)
  Dept: [phone]   Congregational Nurse Program Note  Date of Encounter: 01/08/2024  Client requested a blood pressure at [facility]. BP 133/86 (BP Location: Left Arm, Patient Position: Sitting, Cuff Size: Normal)   Pulse 93   Client reported she was homeless, and church will provide a tent for her tonight, verbalized she  did not have a reason for a visit other than asking for a blood pressure. Not taking any medications currently. When asked if she had a doctor she reported she did not have a doctor and no Medicaid in [place]. Provided written information to contact [employer], informed client she can go to [employer] if unable to call by phone, and to ask [employer] for help with Medicaid and resources. Client verbalized understanding. Informed client [facility] can take clients without insurance if needed, will provide medical care, information provided in writing for [facility]. Client verbalized understanding.  Past Medical History: Past Medical History:  Diagnosis Date   Anxiety    Chronic back pain     Encounter Details:  Community Questionnaire - 01/08/24 2222       Questionnaire   Ask client: Do you give verbal consent for me to treat you today? Yes    Student Assistance N/A    Location Patient Served  [facility]    Encounter Setting CN site    Population Status Unhoused    Insurance Unknown    Insurance/Financial Assistance Referral N/A    Medication N/A    Medical Provider No    Screening Referrals Made N/A    Medical Referrals Made Non-[facility] PCP/Clinic    Medical Appointment Completed N/A    CNP Interventions Advocate/Support;[facility];Case Management;Counsel    Screenings CN Performed Blood Pressure    ED Visit Averted N/A    Life-Saving Intervention Made N/A
